# Patient Record
Sex: Female | Born: 1975 | Race: White | Hispanic: No | Marital: Married
Health system: Southern US, Community
[De-identification: ages and names within clinical notes are randomized; demographics above are authoritative.]

## PROBLEM LIST (undated history)

## (undated) ENCOUNTER — Emergency Department (HOSPITAL_COMMUNITY): Payer: BC Managed Care – PPO

## (undated) DIAGNOSIS — G43909 Migraine, unspecified, not intractable, without status migrainosus: Secondary | ICD-10-CM

## (undated) DIAGNOSIS — E7030 Albinism, unspecified: Secondary | ICD-10-CM

## (undated) DIAGNOSIS — J45909 Unspecified asthma, uncomplicated: Secondary | ICD-10-CM

## (undated) DIAGNOSIS — H5500 Unspecified nystagmus: Secondary | ICD-10-CM

## (undated) DIAGNOSIS — F329 Major depressive disorder, single episode, unspecified: Secondary | ICD-10-CM

## (undated) DIAGNOSIS — K219 Gastro-esophageal reflux disease without esophagitis: Secondary | ICD-10-CM

## (undated) DIAGNOSIS — F32A Depression, unspecified: Secondary | ICD-10-CM

## (undated) DIAGNOSIS — F419 Anxiety disorder, unspecified: Secondary | ICD-10-CM

## (undated) DIAGNOSIS — IMO0001 Reserved for inherently not codable concepts without codable children: Secondary | ICD-10-CM

## (undated) HISTORY — DX: Albinism, unspecified: E70.30

## (undated) HISTORY — PX: TOOTH EXTRACTION: SUR596

## (undated) HISTORY — PX: UPPER GASTROINTESTINAL ENDOSCOPY: SHX188

## (undated) HISTORY — DX: Migraine, unspecified, not intractable, without status migrainosus: G43.909

## (undated) HISTORY — DX: Anxiety disorder, unspecified: F41.9

## (undated) HISTORY — DX: Unspecified nystagmus: H55.00

## (undated) HISTORY — DX: Major depressive disorder, single episode, unspecified: F32.9

## (undated) HISTORY — DX: Depression, unspecified: F32.A

## (undated) HISTORY — PX: EYE SURGERY: SHX253

## (undated) HISTORY — DX: Gastro-esophageal reflux disease without esophagitis: K21.9

---

## 2000-10-29 ENCOUNTER — Emergency Department (HOSPITAL_COMMUNITY): Admission: EM | Admit: 2000-10-29 | Discharge: 2000-10-30 | Payer: Self-pay | Admitting: Emergency Medicine

## 2001-11-07 ENCOUNTER — Encounter: Admission: RE | Admit: 2001-11-07 | Discharge: 2001-11-07 | Payer: Self-pay | Admitting: Sports Medicine

## 2001-11-21 ENCOUNTER — Encounter: Admission: RE | Admit: 2001-11-21 | Discharge: 2001-12-28 | Payer: Self-pay

## 2002-09-08 ENCOUNTER — Emergency Department (HOSPITAL_COMMUNITY): Admission: EM | Admit: 2002-09-08 | Discharge: 2002-09-08 | Payer: Self-pay | Admitting: Emergency Medicine

## 2003-09-14 ENCOUNTER — Emergency Department (HOSPITAL_COMMUNITY): Admission: EM | Admit: 2003-09-14 | Discharge: 2003-09-14 | Payer: Self-pay | Admitting: Emergency Medicine

## 2003-09-28 ENCOUNTER — Encounter: Admission: RE | Admit: 2003-09-28 | Discharge: 2003-09-28 | Payer: Self-pay | Admitting: Orthopedic Surgery

## 2003-11-07 ENCOUNTER — Emergency Department (HOSPITAL_COMMUNITY): Admission: EM | Admit: 2003-11-07 | Discharge: 2003-11-07 | Payer: Self-pay | Admitting: Emergency Medicine

## 2003-11-23 ENCOUNTER — Ambulatory Visit (HOSPITAL_COMMUNITY): Admission: RE | Admit: 2003-11-23 | Discharge: 2003-11-23 | Payer: Self-pay | Admitting: Neurology

## 2003-12-28 ENCOUNTER — Emergency Department (HOSPITAL_COMMUNITY): Admission: EM | Admit: 2003-12-28 | Discharge: 2003-12-28 | Payer: Self-pay | Admitting: Emergency Medicine

## 2003-12-30 ENCOUNTER — Other Ambulatory Visit: Admission: RE | Admit: 2003-12-30 | Discharge: 2003-12-30 | Payer: Self-pay | Admitting: Gynecology

## 2004-11-20 ENCOUNTER — Emergency Department (HOSPITAL_COMMUNITY): Admission: EM | Admit: 2004-11-20 | Discharge: 2004-11-21 | Payer: Self-pay | Admitting: Emergency Medicine

## 2004-12-09 ENCOUNTER — Encounter: Admission: RE | Admit: 2004-12-09 | Discharge: 2004-12-09 | Payer: Self-pay | Admitting: Internal Medicine

## 2006-05-30 ENCOUNTER — Other Ambulatory Visit: Admission: RE | Admit: 2006-05-30 | Discharge: 2006-05-30 | Payer: Self-pay | Admitting: Gynecology

## 2006-07-20 ENCOUNTER — Emergency Department (HOSPITAL_COMMUNITY): Admission: EM | Admit: 2006-07-20 | Discharge: 2006-07-21 | Payer: Self-pay | Admitting: Emergency Medicine

## 2008-04-15 ENCOUNTER — Inpatient Hospital Stay (HOSPITAL_COMMUNITY): Admission: RE | Admit: 2008-04-15 | Discharge: 2008-04-18 | Payer: Self-pay | Admitting: Obstetrics and Gynecology

## 2008-04-19 ENCOUNTER — Inpatient Hospital Stay (HOSPITAL_COMMUNITY): Admission: AD | Admit: 2008-04-19 | Discharge: 2008-04-19 | Payer: Self-pay | Admitting: Obstetrics and Gynecology

## 2008-04-22 ENCOUNTER — Inpatient Hospital Stay (HOSPITAL_COMMUNITY): Admission: AD | Admit: 2008-04-22 | Discharge: 2008-04-22 | Payer: Self-pay | Admitting: Obstetrics and Gynecology

## 2010-07-14 ENCOUNTER — Ambulatory Visit (HOSPITAL_COMMUNITY)
Admission: RE | Admit: 2010-07-14 | Discharge: 2010-07-14 | Payer: Self-pay | Source: Home / Self Care | Admitting: Obstetrics and Gynecology

## 2010-07-26 LAB — HEPATITIS B SURFACE ANTIGEN: Hepatitis B Surface Ag: NEGATIVE

## 2010-07-26 LAB — RUBELLA ANTIBODY, IGM: Rubella: IMMUNE

## 2010-07-26 LAB — ABO/RH: RH Type: NEGATIVE

## 2010-08-15 NOTE — L&D Delivery Note (Signed)
Delivery Note At 4:18 PM a healthy female was delivered via Vaginal, Spontaneous Delivery (Presentation: Left Occiput Anterior).  APGAR: 9, 9; weight 6 lb 6.1 oz (2895 g).   Placenta status: Intact, Spontaneous.  Cord: 3 vessels with the following complications: None.   Episiotomy: None Lacerations: 2nd degree Suture Repair: 3.0 vicryl rapide Est. Blood Loss (mL): 300  Mom to postpartum.  Baby to nursery-stable.  Oliver Pila 02/24/2011, 6:59 PM

## 2010-12-01 LAB — RPR: RPR: NONREACTIVE

## 2010-12-31 NOTE — Discharge Summary (Signed)
Kimberly Fernandez, WEINMANN NO.:  [id number]   MEDICAL RECORD NO.:  [id number]          PATIENT TYPE:  INP   LOCATION:                                FACILITY:  [facility]   PHYSICIAN:  Huel Cote, M.D. DATE OF BIRTH:  1975-11-05   DATE OF ADMISSION:  04/15/2008  DATE OF DISCHARGE:  04/18/2008                               DISCHARGE SUMMARY   DISCHARGE DIAGNOSES:  1. Post-term pregnancy at 14-6/7 weeks delivered.  2. Status post vacuum-assisted vaginal delivery.   DISCHARGE MEDICATIONS:  1. Motrin 600 mg p.o. every 6 hours.  2. Iron sulfate 325 mg twice daily.   HOSPITAL COURSE:  The patient was a 35 year old, G1, P0, who was  admitted at 11 and 6/7 weeks' gestation for induction of labor given  post due date.  Prenatal care was eventful only for the patient having  albinism and nystagmus with a positive group B Strep status.   Prenatal labs are as follows:  O negative, antibody negative, RPR  nonreactive, rubella immune, hepatitis B surface antigen negative, HIV  negative, GC negative, Chlamydia negative, group B Strep positive, 1-  hour Glucola 121, first trimester screen normal.   PAST OBSTETRICAL HISTORY:  None.   PAST MEDICAL HISTORY:  History of depression requiring no medications.   PAST SURGICAL HISTORY:  None.   PAST GYN HISTORY:  None.   On admission, she was afebrile with stable vital signs.  Fetal heart  rate was reactive.  Cervix was 51+ in a minus 2 station.  She had  rupture of membranes performed with clear fluid noted and was placed on  penicillin for her positive group B Strep status before this occurred.  She progressed throughout the evening and reached complete dilation with  pushing of approximately 1-1/2 hour with descent of vertex to the  outlet.  Fetal heart rate remained overall reassuring, but had  persistent variable decelerations which reached down to the 70s with  slowing recovery over the last 5-10 minutes of pushing.  She was  counseled and agreed to a vacuum-assisted delivery with a Kiwi vacuum  applied to a +2 to +3 station ROA in vertex.  The vertex was delivered  to crowning with one pull and popped off at the very end.  The patient  pushed out the remainder of the head over a second-degree laceration.  Nuchal x2 was reduced over the head.  The remainder of the body  delivered without problem.  Apgars were 6 and 9.  The baby was slightly  floppy, but responded quickly to stimulation.  There was also a true  knot noted in the cord.  Second-degree laceration was repaired with 2-0  Vicryl.  Cervix and rectum were intact, and the sphincter was reinforced  as well with 2-0 Vicryl.  She then admitted for routine postpartum care  and was noted to have some elevated blood pressures of 150s/90s.  She  had PIH labs performed which were normal and was having no significant  symptoms at that time.  Therefore, she was felt stable to be discharged  home with close follow  up in the  office of her blood pressure within the next few days.  She was  discharged home with her Motrin and iron given a hemoglobin of 7.9;  however, starting hemoglobin was 10.1.  She understands her instructions  and pelvic rest and will follow up in the office in the next few days  for a blood pressure check.      Huel Cote, M.D.  Electronically Signed     KR/MEDQ  D:  05/19/2008  T:  05/20/2008  Job:  [id number]

## 2011-02-19 ENCOUNTER — Other Ambulatory Visit (HOSPITAL_COMMUNITY): Payer: Self-pay | Admitting: *Deleted

## 2011-02-19 ENCOUNTER — Encounter (HOSPITAL_COMMUNITY): Payer: Self-pay | Admitting: *Deleted

## 2011-02-24 ENCOUNTER — Encounter (HOSPITAL_COMMUNITY): Payer: Self-pay | Admitting: *Deleted

## 2011-02-24 ENCOUNTER — Encounter (HOSPITAL_COMMUNITY): Payer: Self-pay | Admitting: Anesthesiology

## 2011-02-24 ENCOUNTER — Encounter (HOSPITAL_COMMUNITY): Payer: BC Managed Care – PPO

## 2011-02-24 ENCOUNTER — Inpatient Hospital Stay (HOSPITAL_COMMUNITY): Payer: BC Managed Care – PPO | Admitting: Anesthesiology

## 2011-02-24 ENCOUNTER — Inpatient Hospital Stay (HOSPITAL_COMMUNITY)
Admission: AD | Admit: 2011-02-24 | Discharge: 2011-02-26 | DRG: 373 | Disposition: A | Discharge status: Home / Self Care | Payer: BC Managed Care – PPO | Source: Ambulatory Visit | Attending: Obstetrics and Gynecology | Admitting: Obstetrics and Gynecology

## 2011-02-24 DIAGNOSIS — O365990 Maternal care for other known or suspected poor fetal growth, unspecified trimester, not applicable or unspecified: Principal | ICD-10-CM | POA: Diagnosis present

## 2011-02-24 LAB — CBC
Hemoglobin: 11 g/dL — ABNORMAL LOW (ref 12.0–15.0)
MCH: 28.4 pg (ref 26.0–34.0)
MCHC: 32.8 g/dL (ref 30.0–36.0)
Platelets: 157 10*3/uL (ref 150–400)

## 2011-02-24 LAB — RPR: RPR Ser Ql: NONREACTIVE

## 2011-02-24 MED ORDER — EPHEDRINE 5 MG/ML INJ
10.0000 mg | INTRAVENOUS | Status: DC | PRN
  Filled 2011-02-24 (×2): qty 4

## 2011-02-24 MED ORDER — RHO D IMMUNE GLOBULIN 1500 UNIT/2ML IJ SOLN: 300.0000 ug | Freq: Once | INTRAMUSCULAR | Status: DC

## 2011-02-24 MED ORDER — PHENYLEPHRINE 40 MCG/ML (10ML) SYRINGE FOR IV PUSH (FOR BLOOD PRESSURE SUPPORT)
80.0000 ug | PREFILLED_SYRINGE | INTRAVENOUS | Status: DC | PRN
  Filled 2011-02-24: qty 5

## 2011-02-24 MED ORDER — ONDANSETRON HCL 4 MG/2ML IJ SOLN: 4.0000 mg | INTRAMUSCULAR | Status: DC | PRN

## 2011-02-24 MED ORDER — IBUPROFEN 600 MG PO TABS
600.0000 mg | ORAL_TABLET | Freq: Four times a day (QID) | ORAL | Status: DC
  Administered 2011-02-25 – 2011-02-26 (×7): 600 mg via ORAL
  Filled 2011-02-24: qty 1

## 2011-02-24 MED ORDER — IBUPROFEN 600 MG PO TABS
600.0000 mg | ORAL_TABLET | Freq: Four times a day (QID) | ORAL | Status: DC | PRN
  Filled 2011-02-24 (×6): qty 1

## 2011-02-24 MED ORDER — SIMETHICONE 80 MG PO CHEW: 80.0000 mg | CHEWABLE_TABLET | ORAL | Status: DC | PRN

## 2011-02-24 MED ORDER — ONDANSETRON HCL 4 MG/2ML IJ SOLN: 4.0000 mg | Freq: Four times a day (QID) | INTRAMUSCULAR | Status: DC | PRN

## 2011-02-24 MED ORDER — ZOLPIDEM TARTRATE 5 MG PO TABS: 5.0000 mg | ORAL_TABLET | Freq: Every evening | ORAL | Status: DC | PRN

## 2011-02-24 MED ORDER — LIDOCAINE HCL (PF) 1 % IJ SOLN
30.0000 mL | Freq: Once | INTRAMUSCULAR | Status: AC | PRN
  Administered 2011-02-24: 30 mL via SUBCUTANEOUS
  Filled 2011-02-24 (×2): qty 30

## 2011-02-24 MED ORDER — PSEUDOEPHEDRINE HCL 30 MG PO TABS
30.0000 mg | ORAL_TABLET | Freq: Four times a day (QID) | ORAL | Status: DC | PRN
  Administered 2011-02-24 (×2): 30 mg via ORAL
  Filled 2011-02-24 (×2): qty 1

## 2011-02-24 MED ORDER — NALBUPHINE HCL 10 MG/ML IJ SOLN
5.0000 mg | INTRAMUSCULAR | Status: DC | PRN
  Filled 2011-02-24: qty 1

## 2011-02-24 MED ORDER — OXYTOCIN 20 UNITS IN LACTATED RINGERS INFUSION - SIMPLE
1.0000 m[IU]/min | INTRAVENOUS | Status: DC
Start: 2011-02-24 — End: 2011-02-24
  Administered 2011-02-24: 2 m[IU]/min via INTRAVENOUS
  Administered 2011-02-24: 8 m[IU]/min via INTRAVENOUS
  Administered 2011-02-24: 333 m[IU]/min via INTRAVENOUS

## 2011-02-24 MED ORDER — SENNOSIDES-DOCUSATE SODIUM 8.6-50 MG PO TABS
1.0000 | ORAL_TABLET | Freq: Every day | ORAL | Status: DC
  Administered 2011-02-25: 1 via ORAL

## 2011-02-24 MED ORDER — OXYTOCIN 20 UNITS IN LACTATED RINGERS INFUSION - SIMPLE
125.0000 mL/h | Freq: Once | INTRAVENOUS | Status: DC
  Filled 2011-02-24: qty 1000

## 2011-02-24 MED ORDER — PHENYLEPHRINE 40 MCG/ML (10ML) SYRINGE FOR IV PUSH (FOR BLOOD PRESSURE SUPPORT)
80.0000 ug | PREFILLED_SYRINGE | INTRAVENOUS | Status: DC | PRN
  Filled 2011-02-24 (×2): qty 5

## 2011-02-24 MED ORDER — FLEET ENEMA 7-19 GM/118ML RE ENEM: 1.0000 | ENEMA | RECTAL | Status: DC | PRN

## 2011-02-24 MED ORDER — AZITHROMYCIN 500 MG PO TABS
500.0000 mg | ORAL_TABLET | Freq: Every day | ORAL | Status: DC
  Filled 2011-02-24: qty 1

## 2011-02-24 MED ORDER — LIDOCAINE HCL 1.5 % IJ SOLN
INTRAMUSCULAR | Status: DC | PRN
  Administered 2011-02-24: 2 mL
  Administered 2011-02-24 (×2): 5 mL

## 2011-02-24 MED ORDER — BENZOCAINE-MENTHOL 20-0.5 % EX AERO: 1.0000 "application " | INHALATION_SPRAY | CUTANEOUS | Status: DC | PRN

## 2011-02-24 MED ORDER — NALBUPHINE SYRINGE 5 MG/0.5 ML
INJECTION | INTRAMUSCULAR | Status: AC
  Administered 2011-02-24: 5 mg via INTRAVENOUS
  Filled 2011-02-24: qty 0.5

## 2011-02-24 MED ORDER — OXYCODONE-ACETAMINOPHEN 5-325 MG PO TABS
2.0000 | ORAL_TABLET | ORAL | Status: DC | PRN
  Filled 2011-02-24: qty 1

## 2011-02-24 MED ORDER — DIPHENHYDRAMINE HCL 25 MG PO CAPS: 25.0000 mg | ORAL_CAPSULE | Freq: Four times a day (QID) | ORAL | Status: DC | PRN

## 2011-02-24 MED ORDER — LACTATED RINGERS IV SOLN
INTRAVENOUS | Status: DC
  Administered 2011-02-24: 09:00:00 via INTRAVENOUS

## 2011-02-24 MED ORDER — LACTATED RINGERS IV SOLN
500.0000 mL | INTRAVENOUS | Status: AC | PRN
  Administered 2011-02-24: 1000 mL via INTRAVENOUS

## 2011-02-24 MED ORDER — AZITHROMYCIN 250 MG PO TABS: 250.0000 mg | ORAL_TABLET | Freq: Every day | ORAL | Status: DC

## 2011-02-24 MED ORDER — ONDANSETRON HCL 4 MG PO TABS: 4.0000 mg | ORAL_TABLET | ORAL | Status: DC | PRN

## 2011-02-24 MED ORDER — FENTANYL 2.5 MCG/ML BUPIVACAINE 1/10 % EPIDURAL INFUSION (WH - ANES)
2.0000 mL/h | INTRAMUSCULAR | Status: DC
  Administered 2011-02-24 (×2): 14 mL/h via EPIDURAL
  Filled 2011-02-24 (×2): qty 60

## 2011-02-24 MED ORDER — TETANUS-DIPHTH-ACELL PERTUSSIS 5-2.5-18.5 LF-MCG/0.5 IM SUSP
0.5000 mL | Freq: Once | INTRAMUSCULAR | Status: AC
  Administered 2011-02-26: 0.5 mL via INTRAMUSCULAR
  Filled 2011-02-24: qty 0.5

## 2011-02-24 MED ORDER — LACTATED RINGERS IV SOLN: 500.0000 mL | Freq: Once | INTRAVENOUS | Status: DC

## 2011-02-24 MED ORDER — DIPHENHYDRAMINE HCL 50 MG/ML IJ SOLN
12.5000 mg | INTRAMUSCULAR | Status: DC | PRN
  Administered 2011-02-24: 50 mg via INTRAVENOUS
  Filled 2011-02-24: qty 1

## 2011-02-24 MED ORDER — OXYCODONE-ACETAMINOPHEN 5-325 MG PO TABS
1.0000 | ORAL_TABLET | ORAL | Status: DC | PRN
  Administered 2011-02-24 – 2011-02-25 (×2): 1 via ORAL
  Filled 2011-02-24: qty 1

## 2011-02-24 MED ORDER — WITCH HAZEL-GLYCERIN EX PADS: MEDICATED_PAD | CUTANEOUS | Status: DC | PRN

## 2011-02-24 MED ORDER — TERBUTALINE SULFATE 1 MG/ML IJ SOLN: 0.2500 mg | Freq: Once | INTRAMUSCULAR | Status: DC | PRN

## 2011-02-24 MED ORDER — CITRIC ACID-SODIUM CITRATE 334-500 MG/5ML PO SOLN: 30.0000 mL | ORAL | Status: DC | PRN

## 2011-02-24 MED ORDER — PSEUDOEPHEDRINE HCL 30 MG/5ML PO LIQD: 30.0000 mg | Freq: Four times a day (QID) | ORAL | Status: DC

## 2011-02-24 MED ORDER — EPHEDRINE 5 MG/ML INJ
10.0000 mg | INTRAVENOUS | Status: DC | PRN
  Filled 2011-02-24: qty 4

## 2011-02-24 MED ORDER — PRENATAL PLUS 27-1 MG PO TABS
1.0000 | ORAL_TABLET | Freq: Every day | ORAL | Status: DC
  Administered 2011-02-25 – 2011-02-26 (×2): 1 via ORAL
  Filled 2011-02-24 (×2): qty 1

## 2011-02-24 MED ORDER — LANOLIN HYDROUS EX OINT: TOPICAL_OINTMENT | CUTANEOUS | Status: DC | PRN

## 2011-02-24 MED ORDER — ACETAMINOPHEN 325 MG PO TABS: 650.0000 mg | ORAL_TABLET | ORAL | Status: DC | PRN

## 2011-02-24 NOTE — Anesthesia Preprocedure Evaluation (Signed)
Anesthesia Evaluation  Name, MR# and DOB Patient awake  General Assessment Comment  Reviewed: Allergy & Precautions, H&P  and Patient's Chart, lab work & pertinent test results  History of Anesthesia Complications Negative for: history of anesthetic complications  Airway Mallampati: III TM Distance: >3 FB Neck ROM: full    Dental No notable dental hx (+) Teeth Intact   Pulmonaryneg pulmonary ROS    clear to auscultation  pulmonary exam normal   Cardiovascular regular Normal   Neuro/PsychNegative Neurological ROS Negative Psych ROS  GI/Hepatic/Renal negative GI ROS, negative Liver ROS, and negative Renal ROS (+)       Endo/Other  Negative Endocrine ROS (+)   Abdominal   Musculoskeletal  Hematology negative hematology ROS (+)   Peds  Reproductive/Obstetrics (+) Pregnancy   Anesthesia Other Findings             Anesthesia Physical Anesthesia Plan  ASA: II  Anesthesia Plan: Epidural   Post-op Pain Management:    Induction:   Airway Management Planned:   Additional Equipment:   Intra-op Plan:   Post-operative Plan:   Informed Consent: I have reviewed the patients History and Physical, chart, labs and discussed the procedure including the risks, benefits and alternatives for the proposed anesthesia with the patient or authorized representative who has indicated his/her understanding and acceptance.     Plan Discussed with:   Anesthesia Plan Comments:         Anesthesia Quick Evaluation

## 2011-02-24 NOTE — Anesthesia Procedure Notes (Addendum)
Epidural Patient location during procedure: OB Start time: 02/24/2011 12:55 PM Reason for block: procedure for pain  Staffing Anesthesiologist: CASSIDY, AMY L. Performed by: anesthesiologist   Preanesthetic Checklist Completed: patient identified, site marked, surgical consent, pre-op evaluation, timeout performed, IV checked, risks and benefits discussed and monitors and equipment checked  Epidural Patient position: sitting Prep: site prepped and draped and DuraPrep Patient monitoring: continuous pulse ox, blood pressure and heart rate Approach: midline Injection technique: LOR air  Needle:  Needle type: Tuohy  Needle gauge: 17 G Needle length: 9 cm Needle insertion depth: 6 cm cm Catheter type: closed end flexible Catheter size: 19 Gauge Catheter at skin depth: 11 cm Test dose: 1.5% lidocaine  Assessment Events: blood not aspirated, injection not painful, no injection resistance, negative IV test and no paresthesia  Additional Notes Difficult due to pt. Movement and poor position. Discussed risk of headache, infection, bleeding, nerve injury and failed or incomplete block.  Patient voices understanding and wishes to proceed.

## 2011-02-24 NOTE — Progress Notes (Signed)
Kimberly Fernandez is a 35 y.o. G2P1001 at [redacted]w[redacted]d by LMP admitted for induction of labor due to Poor fetal growth.  Subjective: Pt is comfortable with epidural  Objective: BP 131/71  Pulse 91  Temp(Src) 98 F (36.7 C) (Oral)  Resp 20  Ht 5\' 3"  (1.6 m)  Wt 90.266 kg (199 lb)  BMI 35.25 kg/m2  LMP 05/23/2010      FHT:  FHR: 140 bpm, variability: moderate,  accelerations:  Present,  decelerations:  Absent UC:   regular, every 4 minutes SVE:  50/3/-2 posterior IUPC placed without difficulty Labs: Lab Results  Component Value Date   WBC 13.6* 02/24/2011   HGB 11.0* 02/24/2011   HCT 33.5* 02/24/2011   MCV 86.6 02/24/2011   PLT 157 02/24/2011    Assessment / Plan: Induction of labor due to Poor fetal growth,  progressing well on pitocin  Labor: Progressing normally  Fetal Wellbeing:  Category I Pain Control:  Epidural I/D:  n/a Anticipated MOD:  NSVD  Kimberly Fernandez 02/24/2011, 1:50 PM

## 2011-02-24 NOTE — H&P (Signed)
Kimberly Fernandez is a 35 y.o. female presenting for induction of labor given term status at 39+ weeks and desire for induction given issues with husband starting a new job and  A recent US showing possible developing SGA with EFW at the 17%ile.  NST's and fluid have been WNL.  Prenatal care complicated by h/o albinism in mother, no other significant problems. Rh negative, received rhogam. Maternal Medical History:  Contractions: Frequency: irregular.   Perceived severity is mild.    Fetal activity: Perceived fetal activity is normal.    Prenatal complications: No bleeding.     OB History    Grav Para Term Preterm Abortions TAB SAB Ect Mult Living   2 1 1  0 0 0 0 0 0 1     History reviewed. No pertinent past surgical history. Family History: family history includes Cancer in her maternal grandfather; Heart disease in her maternal grandmother; and Hypertension in her father. Social History:  reports that she has never smoked. She does not have any smokeless tobacco history on file. She reports that she does not drink alcohol or use illicit drugs. Past MedHx  Albinism                      Nystagmus           H/o depression PastObHx  NSVD 6#8oz 2009  ROS  Negative  FHR reactive with irregular ctx Dilation: 2 Effacement (%): 30 Station: -2 Exam by:: m wilkins,rnc Blood pressure 143/67, pulse 105, temperature 97.6 F (36.4 C), resp. rate 20, height 5\' 3"  (1.6 m), weight 90.266 kg (199 lb), last menstrual period 05/23/2010. Exam   Physical Exam  CV RRR Lungs CTA bilaterally Abd gravid NT  Prenatal labs: ABO, Rh:   Antibody: Negative (12/12 0000) Rubella:   RPR: Nonreactive (04/18 0000)  HBsAg: Negative (12/12 0000)  HIV: Non-reactive (04/18 0000)  GBS:  negative One hour glucola 107 First trimester Screen WNL AFP WNL GC negative Chlam negative    Assessment/Plan: Pt for induction of labor with borderline favorable cervix for possible SGA and social reason.  D/w pt may be  slightly prolonged, but as is multiparous, should not be A big issue.  Desires epidural when in labor.  Oliver Pila 02/24/2011, 10:01 AM

## 2011-02-25 ENCOUNTER — Encounter (HOSPITAL_COMMUNITY): Payer: Self-pay | Admitting: *Deleted

## 2011-02-25 LAB — ABO/RH: ABO/RH(D): O NEG

## 2011-02-25 LAB — CBC
Hemoglobin: 9.9 g/dL — ABNORMAL LOW (ref 12.0–15.0)
Platelets: 147 10*3/uL — ABNORMAL LOW (ref 150–400)
RBC: 3.47 MIL/uL — ABNORMAL LOW (ref 3.87–5.11)
WBC: 15 10*3/uL — ABNORMAL HIGH (ref 4.0–10.5)

## 2011-02-25 MED ORDER — AZITHROMYCIN 250 MG PO TABS
500.0000 mg | ORAL_TABLET | Freq: Once | ORAL | Status: AC
  Administered 2011-02-25: 500 mg via ORAL
  Filled 2011-02-25: qty 2

## 2011-02-25 MED ORDER — AZITHROMYCIN 250 MG PO TABS
250.0000 mg | ORAL_TABLET | Freq: Every day | ORAL | Status: DC
  Administered 2011-02-26: 250 mg via ORAL
  Filled 2011-02-25 (×3): qty 1

## 2011-02-25 MED ORDER — PSEUDOEPHEDRINE HCL 30 MG PO TABS
30.0000 mg | ORAL_TABLET | Freq: Four times a day (QID) | ORAL | Status: DC | PRN
  Administered 2011-02-25: 30 mg via ORAL
  Filled 2011-02-25: qty 1

## 2011-02-25 NOTE — Progress Notes (Signed)
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 inst to breastfeed every 3 hrs and allow cluster feeding . Gave hand pump with inst.

## 2011-02-25 NOTE — Progress Notes (Signed)
Post Partum Day 1 Subjective: no complaints  Objective: Blood pressure 137/62, pulse 74, temperature 97.5 F (36.4 C), temperature source Oral, resp. rate 20, height 5\' 3"  (1.6 m), weight 90.266 kg (199 lb), last menstrual period 05/23/2010, unknown if currently breastfeeding.  Physical Exam:  General: alert Lochia: appropriate Uterine Fundus: firm } DVT Evaluation: No evidence of DVT seen on physical exam.   Basename 02/25/11 0545 02/24/11 0828  HGB 9.9* 11.0*  HCT 30.3* 33.5*    Assessment/Plan: Plan for discharge tomorrow   LOS: 1 day   Kimberly Fernandez W 02/25/2011, 8:38 AM

## 2011-02-26 MED ORDER — OXYCODONE-ACETAMINOPHEN 5-325 MG PO TABS: 1.0000 | ORAL_TABLET | Freq: Four times a day (QID) | ORAL | Status: AC | PRN

## 2011-02-26 MED ORDER — IBUPROFEN 600 MG PO TABS: 600.0000 mg | ORAL_TABLET | Freq: Four times a day (QID) | ORAL | Status: AC | PRN

## 2011-02-26 NOTE — Progress Notes (Signed)
PATIENT HAS NOW DECIDED TO EXCLUSIVELY INSTEAD OF PUMPING.  BREASTFEEDING DISCHARGE TEACHING DONE.  ENCOURAGED TO CALL LC OFFICE WITH QUESTIONS OR CONCERNS.  LPOWELL RN, IBCLC

## 2011-02-26 NOTE — Discharge Summary (Signed)
NAMEMAXINE, FREDMAN NO.:  [id number]  MEDICAL RECORD NO.:  [id number]  LOCATION:  9135                          FACILITY:  [facility]  PHYSICIAN:  Malachi Pro. Ambrose Mantle, M.D. DATE OF BIRTH:  1975-10-15  DATE OF ADMISSION:  02/24/2011 DATE OF DISCHARGE:  02/26/2011                              DISCHARGE SUMMARY   This is a 35 year old female presented for induction of labor given term status at 39+ weeks and desire for induction given issues with her husband starting a new job and a recent ultrasound showing possible developing small for gestational age infant with estimated fetal weight at the 17th percentile.  Nonstress tests and fluid have been normal. Prenatal care complicated by history of albinism in the mother.  No other significant problems.  She is Rh negative and received RhoGAM. Her history has included in her H&P.  The patient was admitted.  The cervix was 2 cm dilated.  Labs showed antibody was negative.  She was Rh negative.  RPR nonreactive, hepatitis B surface antigen negative, HIV negative, GBS negative, 1-hour Glucola 107.  First trimester screen and AFP normal.  GC and Chlamydia negative.  By 4:18 p.m., the patient delivered a healthy female infant vaginally LOA, 6 pounds 6.1 ounces, Apgar's of 9 and 9 at 1 and 5 minutes.  Dr. Senaida Ores was in attendance.  Placenta was intact, spontaneous cord, 3 vessels with no complications.  No episiotomy, second-degree laceration repair with 3-0 Vicryl Rapide, blood loss about 300 mL.  Mother went to postpartum, baby to the nursery stable.  Postpartum, the patient did quite well.  She had no particular problems and was ready for discharge on the second postpartum day.  Her initial hemoglobin was 11.0, hematocrit 33.5, white count 13,600, platelet count 157,000.  Followup hemoglobin 9.9, RPR was nonreactive.  FINAL DIAGNOSIS:  Intrauterine pregnancy at close to term, delivered left occipitoanterior (fetal  position).  OPERATIONS:  Spontaneous delivery LOA, repair of second-degree midline laceration.  FINAL CONDITION:  Improved.  Instructions include regular discharge instruction booklet.  The patient is to be given the Tdap vaccine and will receive RhoGAM if it is indicated.  She is to return to the office in 6 weeks.  DISCHARGE MEDICATIONS: 1. Percocet 5/325, 12 tablets 1 every 4-6 hours as needed for pain. 2. Motrin 600 mg 30 tablets 1 every 6 hours as needed for pain.     Malachi Pro. Ambrose Mantle, M.D.     TFH/MEDQ  D:  02/26/2011  T:  02/26/2011  Job:  [id number]

## 2011-02-26 NOTE — Progress Notes (Signed)
#  2 afebrile doing well No complaints.

## 2011-02-28 ENCOUNTER — Other Ambulatory Visit: Payer: Self-pay | Admitting: Obstetrics and Gynecology

## 2011-02-28 ENCOUNTER — Encounter (HOSPITAL_COMMUNITY): Payer: Self-pay | Admitting: *Deleted

## 2011-02-28 ENCOUNTER — Inpatient Hospital Stay (HOSPITAL_COMMUNITY)
Admission: AD | Admit: 2011-02-28 | Discharge: 2011-03-01 | DRG: 376 | Disposition: A | Discharge status: Home / Self Care | Payer: BC Managed Care – PPO | Source: Ambulatory Visit | Attending: Obstetrics and Gynecology | Admitting: Obstetrics and Gynecology

## 2011-02-28 ENCOUNTER — Inpatient Hospital Stay (HOSPITAL_COMMUNITY): Payer: BC Managed Care – PPO

## 2011-02-28 DIAGNOSIS — O99893 Other specified diseases and conditions complicating puerperium: Principal | ICD-10-CM | POA: Diagnosis present

## 2011-02-28 DIAGNOSIS — R7989 Other specified abnormal findings of blood chemistry: Secondary | ICD-10-CM | POA: Diagnosis present

## 2011-02-28 DIAGNOSIS — E876 Hypokalemia: Secondary | ICD-10-CM | POA: Diagnosis present

## 2011-02-28 DIAGNOSIS — R51 Headache: Secondary | ICD-10-CM | POA: Diagnosis present

## 2011-02-28 DIAGNOSIS — R0602 Shortness of breath: Secondary | ICD-10-CM | POA: Diagnosis present

## 2011-02-28 LAB — URINALYSIS, ROUTINE W REFLEX MICROSCOPIC
Glucose, UA: NEGATIVE mg/dL
Specific Gravity, Urine: 1.02 (ref 1.005–1.030)
Urobilinogen, UA: 1 mg/dL (ref 0.0–1.0)
pH: 6.5 (ref 5.0–8.0)

## 2011-02-28 LAB — COMPREHENSIVE METABOLIC PANEL
ALT: 31 U/L (ref 0–35)
AST: 44 U/L — ABNORMAL HIGH (ref 0–37)
Albumin: 2.5 g/dL — ABNORMAL LOW (ref 3.5–5.2)
Alkaline Phosphatase: 116 U/L (ref 39–117)
Potassium: 2.7 mEq/L — CL (ref 3.5–5.1)
Sodium: 138 mEq/L (ref 135–145)
Total Protein: 6.7 g/dL (ref 6.0–8.3)

## 2011-02-28 LAB — URINE MICROSCOPIC-ADD ON: Urine-Other: <10

## 2011-02-28 LAB — CBC
MCH: 28.7 pg (ref 26.0–34.0)
MCHC: 32.5 g/dL (ref 30.0–36.0)
Platelets: 166 10*3/uL (ref 150–400)
RBC: 3.66 MIL/uL — ABNORMAL LOW (ref 3.87–5.11)

## 2011-02-28 LAB — DIFFERENTIAL
Basophils Relative: 0 % (ref 0–1)
Eosinophils Absolute: 0.2 10*3/uL (ref 0.0–0.7)
Neutro Abs: 6.8 10*3/uL (ref 1.7–7.7)
Neutrophils Relative %: 69 % (ref 43–77)

## 2011-02-28 MED ORDER — SODIUM CHLORIDE 0.9 % IJ SOLN
3.0000 mL | INTRAMUSCULAR | Status: DC | PRN
  Administered 2011-03-01: 3 mL via INTRAVENOUS

## 2011-02-28 MED ORDER — DOCUSATE SODIUM 100 MG PO CAPS
100.0000 mg | ORAL_CAPSULE | Freq: Every day | ORAL | Status: DC
  Administered 2011-03-01: 100 mg via ORAL
  Filled 2011-02-28 (×2): qty 1

## 2011-02-28 MED ORDER — GUAIFENESIN 100 MG/5ML PO SOLN: 15.0000 mL | ORAL | Status: DC | PRN

## 2011-02-28 MED ORDER — KCL-LACTATED RINGERS 20 MEQ/L IV SOLN
INTRAVENOUS | Status: DC
  Filled 2011-02-28: qty 1000

## 2011-02-28 MED ORDER — ZOLPIDEM TARTRATE 5 MG PO TABS: 5.0000 mg | ORAL_TABLET | Freq: Every evening | ORAL | Status: DC | PRN

## 2011-02-28 MED ORDER — KCL-LACTATED RINGERS 20 MEQ/L IV SOLN
INTRAVENOUS | Status: DC
  Filled 2011-02-28 (×2): qty 1000

## 2011-02-28 MED ORDER — MENTHOL 3 MG MT LOZG: 1.0000 | LOZENGE | OROMUCOSAL | Status: DC | PRN

## 2011-02-28 MED ORDER — MAGNESIUM SULFATE 40 MG/ML IJ SOLN
4.0000 g | Freq: Once | INTRAMUSCULAR | Status: AC
  Administered 2011-02-28: 4 g via INTRAVENOUS
  Filled 2011-02-28: qty 100

## 2011-02-28 MED ORDER — ONDANSETRON HCL 4 MG PO TABS: 4.0000 mg | ORAL_TABLET | Freq: Four times a day (QID) | ORAL | Status: DC | PRN

## 2011-02-28 MED ORDER — PRENATAL PLUS 27-1 MG PO TABS
1.0000 | ORAL_TABLET | Freq: Every day | ORAL | Status: DC
  Administered 2011-03-01: 1 via ORAL
  Filled 2011-02-28 (×2): qty 1

## 2011-02-28 MED ORDER — SIMETHICONE 80 MG PO CHEW: 80.0000 mg | CHEWABLE_TABLET | Freq: Four times a day (QID) | ORAL | Status: DC | PRN

## 2011-02-28 MED ORDER — FUROSEMIDE 20 MG PO TABS
20.0000 mg | ORAL_TABLET | Freq: Once | ORAL | Status: AC
  Administered 2011-02-28: 20 mg via ORAL
  Filled 2011-02-28: qty 1

## 2011-02-28 MED ORDER — MAGNESIUM SULFATE 40 G IN LACTATED RINGERS - SIMPLE
2.0000 g/h | Freq: Once | INTRAVENOUS | Status: DC
  Filled 2011-02-28: qty 500

## 2011-02-28 MED ORDER — OXYCODONE-ACETAMINOPHEN 5-325 MG PO TABS
1.0000 | ORAL_TABLET | Freq: Four times a day (QID) | ORAL | Status: DC | PRN
  Administered 2011-02-28: 1 via ORAL
  Administered 2011-03-01: 2 via ORAL
  Administered 2011-03-01: 1 via ORAL
  Filled 2011-02-28: qty 1
  Filled 2011-02-28: qty 2
  Filled 2011-02-28: qty 1

## 2011-02-28 MED ORDER — POTASSIUM CHLORIDE 10 MEQ/100ML IV SOLN
10.0000 meq | INTRAVENOUS | Status: AC
  Administered 2011-02-28 – 2011-03-01 (×4): 10 meq via INTRAVENOUS
  Filled 2011-02-28 (×4): qty 100

## 2011-02-28 MED ORDER — FUROSEMIDE 20 MG PO TABS
20.0000 mg | ORAL_TABLET | Freq: Every day | ORAL | Status: DC
  Filled 2011-02-28: qty 1

## 2011-02-28 MED ORDER — ONDANSETRON HCL 4 MG/2ML IJ SOLN: 4.0000 mg | Freq: Four times a day (QID) | INTRAMUSCULAR | Status: DC | PRN

## 2011-02-28 MED ORDER — ALUM & MAG HYDROXIDE-SIMETH 200-200-20 MG/5ML PO SUSP
30.0000 mL | ORAL | Status: DC | PRN
  Filled 2011-02-28: qty 30

## 2011-02-28 MED ORDER — POTASSIUM CHLORIDE 2 MEQ/ML IV SOLN
INTRAVENOUS | Status: DC
  Administered 2011-02-28: 22:00:00 via INTRAVENOUS
  Filled 2011-02-28 (×2): qty 1000

## 2011-02-28 MED ORDER — FUROSEMIDE 20 MG PO TABS
20.0000 mg | ORAL_TABLET | Freq: Every day | ORAL | Status: DC
  Administered 2011-03-01: 20 mg via ORAL
  Filled 2011-02-28: qty 1

## 2011-02-28 MED ORDER — IBUPROFEN 600 MG PO TABS
600.0000 mg | ORAL_TABLET | Freq: Four times a day (QID) | ORAL | Status: DC | PRN
Start: 2011-02-28 — End: 2011-03-01
  Administered 2011-03-01 (×2): 600 mg via ORAL
  Filled 2011-02-28 (×2): qty 1

## 2011-02-28 NOTE — Progress Notes (Signed)
Pt states she had a SVD on 7-12. With her last pregnancy she developed pulmonary edema and has been having some of the same symptoms. Feels shortness of breath and feeling like unable to get lungs full. Unproductive cough.

## 2011-02-28 NOTE — ED Provider Notes (Signed)
History    patient is a 35 year old white female who is a gravida 2 para 2. She is approximately 4 days postpartum. She had a normal vaginal delivery. She states 2 days ago she began having what felt like congestion. She is now feeling short of breath. She states that she woke up from her sleep with wheezing last night. She states that she has had similar symptoms with her previous pregnancy. She had to take the Lasix because of fluid retention. She denies fever or or chest pain. She denies any abdominal pain or right upper quadrant pain. She denies any new visual disturbances. She denies headache.  Chief Complaint  Patient presents with  . Shortness of Breath   HPI  OB History    Grav Para Term Preterm Abortions TAB SAB Ect Mult Living   2 2 2  0 0 0 0 0 0 2      Past Medical History  Diagnosis Date  . No pertinent past medical history     Past Surgical History  Procedure Date  . Tooth extraction     Family History  Problem Relation Age of Onset  . Hypertension Father   . Heart disease Maternal Grandmother   . Cancer Maternal Grandfather     History  Substance Use Topics  . Smoking status: Never Smoker   . Smokeless tobacco: Not on file  . Alcohol Use: No    Allergies:  Allergies  Allergen Reactions  . Doxycycline Rash  . Aspirin     Prescriptions prior to admission  Medication Sig Dispense Refill  . acetaminophen (TYLENOL) 500 MG tablet Take 1,000 mg by mouth daily as needed. Patient used for pain.       . diphenhydramine-acetaminophen (TYLENOL PM) 25-500 MG TABS Take 1 tablet by mouth at bedtime as needed. Patient used medication for sleep.       . Pseudoephedrine HCl (SUDAFED PO) Take 1 tablet by mouth daily as needed.        Marland Kitchen amoxicillin (AMOXIL) 500 MG tablet Take 500 mg by mouth 2 (two) times daily.        Marland Kitchen ibuprofen (ADVIL,MOTRIN) 600 MG tablet Take 1 tablet (600 mg total) by mouth every 6 (six) hours as needed for pain.  30 tablet  0  .  oxyCODONE-acetaminophen (PERCOCET) 5-325 MG per tablet Take 1-2 tablets by mouth every 6 (six) hours as needed (moderate - severe pain).  12 tablet  0  . Prenatal Vit-Fe Psac Cmplx-FA (PRENATAL MULTIVITAMIN) 60-1 MG tablet Take 1 tablet by mouth daily with breakfast.       . prenatal vitamin w/FE, FA (PRENATAL 1 + 1) 27-1 MG TABS Take 1 tablet by mouth daily.          Review of Systems  Constitutional: Negative for fever, chills and malaise/fatigue.  HENT: Positive for congestion. Negative for sore throat.   Eyes: Negative for blurred vision and double vision.  Respiratory: Positive for cough, shortness of breath and wheezing. Negative for hemoptysis and sputum production.   Cardiovascular: Negative for chest pain, palpitations and leg swelling.  Gastrointestinal: Negative for nausea, vomiting, abdominal pain, diarrhea and constipation.  Genitourinary: Negative for dysuria, urgency, frequency, hematuria and flank pain.  Neurological: Negative for dizziness and headaches.  Psychiatric/Behavioral: Negative for depression and suicidal ideas.   Physical Exam   Blood pressure 142/63, pulse 64, temperature 98.1 F (36.7 C), temperature source Oral, resp. rate 20, height 5\' 3"  (1.6 m), weight 192 lb 3.2 oz (87.181 kg),  last menstrual period 05/23/2010, SpO2 94.00%, unknown if currently breastfeeding.  Physical Exam  Constitutional: She is oriented to person, place, and time. She appears well-developed and well-nourished. No distress.  HENT:  Head: Normocephalic and atraumatic.  Eyes: EOM are normal. Pupils are equal, round, and reactive to light.  Cardiovascular: Normal rate, regular rhythm and normal heart sounds.  Exam reveals no gallop and no friction rub.   No murmur heard. Respiratory: Breath sounds normal. No respiratory distress. She has no wheezes. She has no rales. She exhibits no tenderness.  GI: Soft. She exhibits no distension and no mass. There is no tenderness. There is no rebound  and no guarding.  Neurological: She is alert and oriented to person, place, and time.  Skin: Skin is warm and dry. She is not diaphoretic.  Psychiatric: She has a normal mood and affect. Her behavior is normal. Judgment and thought content normal.    MAU Course  Procedures  Results for orders placed during the hospital encounter of 02/28/11 (from the past 24 hour(s))  CBC     Status: Abnormal   Collection Time   02/28/11  4:55 PM      Component Value Range   WBC 9.8  4.0 - 10.5 (K/uL)   RBC 3.66 (*) 3.87 - 5.11 (MIL/uL)   Hemoglobin 10.5 (*) 12.0 - 15.0 (g/dL)   HCT 16.1 (*) 09.6 - 46.0 (%)   MCV 88.3  78.0 - 100.0 (fL)   MCH 28.7  26.0 - 34.0 (pg)   MCHC 32.5  30.0 - 36.0 (g/dL)   RDW 04.5  40.9 - 81.1 (%)   Platelets 166  150 - 400 (K/uL)  DIFFERENTIAL     Status: Normal (Preliminary result)   Collection Time   02/28/11  4:55 PM      Component Value Range   Neutrophils Relative PENDING  43 - 77 (%)   Neutro Abs PENDING  1.7 - 7.7 (K/uL)   Band Neutrophils PENDING  0 - 10 (%)   Lymphocytes Relative PENDING  12 - 46 (%)   Lymphs Abs PENDING  0.7 - 4.0 (K/uL)   Monocytes Relative PENDING  3 - 12 (%)   Monocytes Absolute PENDING  0.1 - 1.0 (K/uL)   Eosinophils Relative PENDING  0 - 5 (%)   Eosinophils Absolute PENDING  0.0 - 0.7 (K/uL)   Basophils Relative PENDING  0 - 1 (%)   Basophils Absolute PENDING  0.0 - 0.1 (K/uL)   WBC Morphology PENDING     RBC Morphology PENDING     Smear Review PENDING     nRBC PENDING  0 (/100 WBC)   Metamyelocytes Relative PENDING     Myelocytes PENDING     Promyelocytes Absolute PENDING     Blasts PENDING    COMPREHENSIVE METABOLIC PANEL     Status: Abnormal   Collection Time   02/28/11  4:55 PM      Component Value Range   Sodium 138  135 - 145 (mEq/L)   Potassium 2.7 (*) 3.5 - 5.1 (mEq/L)   Chloride 104  96 - 112 (mEq/L)   CO2 24  19 - 32 (mEq/L)   Glucose, Bld 75  70 - 99 (mg/dL)   BUN 9  6 - 23 (mg/dL)   Creatinine, Ser 9.14 (*)  0.50 - 1.10 (mg/dL)   Calcium 8.5  8.4 - 78.2 (mg/dL)   Total Protein 6.7  6.0 - 8.3 (g/dL)   Albumin 2.5 (*) 3.5 - 5.2 (  g/dL)   AST 44 (*) 0 - 37 (U/L)   ALT 31  0 - 35 (U/L)   Alkaline Phosphatase 116  39 - 117 (U/L)   Total Bilirubin 0.3  0.3 - 1.2 (mg/dL)   GFR calc non Af Amer >60  >60 (mL/min)   GFR calc Af Amer >60  >60 (mL/min)  LACTATE DEHYDROGENASE     Status: Abnormal   Collection Time   02/28/11  4:55 PM      Component Value Range   LD 321 (*) 94 - 250 (U/L)  URIC ACID     Status: Abnormal   Collection Time   02/28/11  4:55 PM      Component Value Range   Uric Acid, Serum 8.1 (*) 2.4 - 7.0 (mg/dL)  URINALYSIS, ROUTINE W REFLEX MICROSCOPIC     Status: Abnormal   Collection Time   02/28/11  6:45 PM      Component Value Range   Color, Urine YELLOW  YELLOW    Appearance HAZY (*) CLEAR    Specific Gravity, Urine 1.020  1.005 - 1.030    pH 6.5  5.0 - 8.0    Glucose, UA NEGATIVE  NEGATIVE (mg/dL)   Hgb urine dipstick LARGE (*) NEGATIVE    Bilirubin Urine NEGATIVE  NEGATIVE    Ketones, ur NEGATIVE  NEGATIVE (mg/dL)   Protein, ur NEGATIVE  NEGATIVE (mg/dL)   Urobilinogen, UA 1.0  0.0 - 1.0 (mg/dL)   Nitrite NEGATIVE  NEGATIVE    Leukocytes, UA SMALL (*) NEGATIVE   URINE MICROSCOPIC-ADD ON     Status: Abnormal   Collection Time   02/28/11  6:45 PM      Component Value Range   Squamous Epithelial / LPF FEW (*) RARE    WBC, UA 7-10  <3 (WBC/hpf)   RBC / HPF TOO NUMEROUS TO COUNT  <3 (RBC/hpf)   Bacteria, UA FEW (*) RARE    Urine-Other <10 RENAL EPIS PER 10 FIELD.      I discussed this patient with Dr. Ellyn Hack at length. She is on her way to evaluate and admit this patient.  Clinton Gallant. Rice III, DrHSc, MPAS, PA-C

## 2011-02-28 NOTE — Progress Notes (Signed)
Pt started yesterday with mild coughing, this morning woke with gurgling, wheezing and making an effort to breath.  Says this happened with last preg after delivery.

## 2011-02-28 NOTE — H&P (Signed)
  History and Physical  35 y.o. [id number] with SOB after SVD on 02/24/11, with h/o pulm edema after delivery.  Admits to mild, persistent HA, no blurry vision, no scotomata, RUQ pain.  Also c/o wheeze.    PMH:  Past Medical History  Diagnosis Date  . No pertinent past medical history     PSH:  Past Surgical History  Procedure Date  . Tooth extraction     [id number]: [id number], s/p SVD x 2; last 02/24/11, no STDs, ? Abn pap.  MEDS:  Current Facility-Administered Medications on File Prior to Encounter  Medication Dose Route Frequency Provider Last Rate Last Dose            Current Outpatient Prescriptions on File Prior to Encounter  Medication Sig Dispense Refill                    . prenatal vitamin w/FE, FA (PRENATAL 1 + 1) 27-1 MG TABS Take 1 tablet by mouth daily.          ALL:  Allergies  Allergen Reactions  . Doxycycline Rash  . Aspirin Easy bruising    SH:  History   Social History  . Marital Status: Married         Number of Children: 2  .     Occupational History  . Not on file.   Social History Main Topics  . Smoking status: Never Smoker   . Smokeless tobacco: Not on file  . Alcohol Use: No  . Drug Use: No  . Sexually Active: Yes     NFP   Other Topics Concern  . Not on file   Social History Narrative  . No narrative on file    FH:  Family History  Problem Relation Age of Onset  . Hypertension Father   . Heart disease Maternal Grandmother   . Cancer Maternal Grandfather     PE; BP 141/67  Pulse 64  Temp(Src) 98.1 F (36.7 C) (Oral)  Resp 18  Ht 5\' 3"  (1.6 m)  Wt 87.181 kg (192 lb 3.2 oz)  BMI 34.05 kg/m2  SpO2 94%  LMP 05/23/2010  Breastfeeding? Unknown  Gen: NAD Neck: no Lymph Adenopathy Thyroid: WNL  HEENT: mmm CV:RRR Lungs: CTAB ABD: soft, NT, ND, +BS Ext: sym, NT  CBC 9.8\10.5/166; Potassium = 2.7, AST = 44, ALT 31; LDH 321; UA neg for protein,   A/P: 35 y.o. [id number] PPD#4 with SOB, elevated uric acid, hypokalemia 1.  Admit for Magnesium for sz prophylaxis 2. Replete K+ 3. In AM eval with CBC, CMP 4. CXR - likely treat with Lasix if edema. 5. Monitor BP closely.  Carmelina Noun, MD

## 2011-03-01 LAB — COMPREHENSIVE METABOLIC PANEL
ALT: 28 U/L (ref 0–35)
AST: 36 U/L (ref 0–37)
Calcium: 7.7 mg/dL — ABNORMAL LOW (ref 8.4–10.5)
Creatinine, Ser: 0.54 mg/dL (ref 0.50–1.10)
GFR calc non Af Amer: >60 mL/min (ref >60)
Sodium: 136 mEq/L (ref 135–145)
Total Protein: 6.4 g/dL (ref 6.0–8.3)

## 2011-03-01 LAB — MRSA PCR SCREENING: MRSA by PCR: NEGATIVE

## 2011-03-01 LAB — CBC
HCT: 30.8 % — ABNORMAL LOW (ref 36.0–46.0)
Hemoglobin: 10.1 g/dL — ABNORMAL LOW (ref 12.0–15.0)
MCH: 28.6 pg (ref 26.0–34.0)
MCHC: 32.8 g/dL (ref 30.0–36.0)
RBC: 3.53 MIL/uL — ABNORMAL LOW (ref 3.87–5.11)

## 2011-03-01 MED ORDER — MAGNESIUM SULFATE 40 G IN LACTATED RINGERS - SIMPLE
2.0000 g/h | INTRAVENOUS | Status: DC
  Administered 2011-02-28: 2 g/h via INTRAVENOUS
  Filled 2011-03-01: qty 500

## 2011-03-01 MED ORDER — FUROSEMIDE 10 MG/ML IJ SOLN
20.0000 mg | INTRAMUSCULAR | Status: DC
  Administered 2011-03-01 (×4): 20 mg via INTRAVENOUS
  Filled 2011-03-01 (×4): qty 2

## 2011-03-01 MED ORDER — POTASSIUM CHLORIDE CRYS ER 20 MEQ PO TBCR
40.0000 meq | EXTENDED_RELEASE_TABLET | Freq: Two times a day (BID) | ORAL | Status: DC
  Administered 2011-03-01 (×2): 40 meq via ORAL
  Filled 2011-03-01 (×3): qty 2

## 2011-03-01 NOTE — Progress Notes (Signed)
UR chart review completed.  

## 2011-03-01 NOTE — Progress Notes (Signed)
Post Partum Day 5 Subjective: Pt feels better this am, wheezing subjectively better.  No other c/o.  Pain controlled.    Objective: Blood pressure 134/72, pulse 71, temperature 98.8 F (37.1 C), temperature source Oral, resp. rate 18, height 5\' 3"  (1.6 m), weight 85.004 kg (187 lb 6.4 oz), last menstrual period 05/23/2010, SpO2 91.00%, unknown if currently breastfeeding.  Physical Exam:  General: alert Lungs  Some mild crackles bilaterally Lochia: appropriate Uterine Fundus: firm     Basename 03/01/11 0505 02/28/11 1655  HGB 10.1* 10.5*  HCT 30.8* 32.3*    Assessment/Plan: Pt admitted for concern of developing pulmonary edema and mildly elevated LFT's, Uric acid.  No proteinuria.  BP WNL on MgSO4. Will d/c magnesium and IVF.  Change to po potassium.  Continue lasix and pulmonary toilet and see how O2 sats do.  If improves with diuresis, possible  D/c home this pm or in am tomorrow.   LOS: 1 day   Jessie Schrieber W 03/01/2011, 9:11 AM

## 2011-03-01 NOTE — Progress Notes (Signed)
Pt discharged with written/verbal instructions - unable to print out discharge papers due to Epic difficulty with previous discharge - unable to reconcile meds therefore unable to give papers to pt.  Pt instructed to continue  With previous meds (postpartum discharge orders).  To call office with any concerns - return to office for scheduled 6 week checkup.

## 2011-03-01 NOTE — Progress Notes (Signed)
Infant awake , latched on independently . Assist just with positioning . Mom comfortable .Inafant in a consistent pattern with multi swallows. Mom had questions returning back to work . Per mom "I work around [employer], [employer] .LC recommended good hand washing and changing clothing before pumping . Mom receptive to teaching. Per mom I have a hand pump .

## 2011-03-01 NOTE — Progress Notes (Signed)
  Pt is feeling much better and breathing well.  O2 sats 95% or better on room air.  With lasix has diuresed well, Is now 3+ liters negative.  BP WNL and labs were WNL this am.  Magnesium off since AM.  CV RRR Lungs CTA bilaterally Abd soft NT  Pt admitted with possible developing pulmonary edema and mildly elevated LFT's.  LFT's normalized on f/u labs and pt diuresed  Well with lasix therapy.  Mild hypokalemia treated with IV and po potassium.  Pt feeling much better and desires d/c home.

## 2011-03-02 ENCOUNTER — Other Ambulatory Visit (HOSPITAL_COMMUNITY): Payer: Self-pay | Admitting: *Deleted

## 2011-04-21 NOTE — Discharge Summary (Signed)
Obstetric Discharge Summary Reason for Admission: wheezing and elevated LFT's, possible pp preeclampsia Prenatal Procedures: none Intrapartum Procedures: N/A Postpartum Procedures: diuresis with lasix, magnesium Complications-Operative and Postpartum: none Hemoglobin  Date Value Range Status  03/01/2011 10.1* 12.0-15.0 (g/dL) Final     HCT  Date Value Range Status  03/01/2011 30.8* 36.0-46.0 (%) Final    Discharge Diagnoses: Mild postpartum preeclampsia  Discharge Information: Date: 04/21/2011 Activity: pelvic rest Diet: routine Medications: Ibuprophen and Percocet Condition: improved Instructions: refer to practice specific booklet Discharge to: home Follow-up Information    Follow up with Oris Staffieri W in 6 weeks. (As needed if symptoms worsen)    Contact information:   [address], [address] [place] [address] [phone]          Newborn Data: Pecola Leisure had been d/c with delivery admission and was doing well. Oliver Pila 04/21/2011, 6:42 AM

## 2011-05-18 LAB — RH IMMUNE GLOB WKUP(>/=20WKS)(NOT WOMEN'S HOSP): Fetal Screen: NEGATIVE

## 2011-05-18 LAB — COMPREHENSIVE METABOLIC PANEL
ALT: 26
AST: 47 — ABNORMAL HIGH
Albumin: 2.3 — ABNORMAL LOW
Alkaline Phosphatase: 103
Alkaline Phosphatase: 112
Alkaline Phosphatase: 119 — ABNORMAL HIGH
BUN: 3 — ABNORMAL LOW
BUN: 7
BUN: 7
CO2: 21
CO2: 23
Calcium: 8.9
Chloride: 106
Chloride: 106
Chloride: 109
Chloride: 110
Creatinine, Ser: 0.51
GFR calc Af Amer: >60
GFR calc Af Amer: >60
GFR calc non Af Amer: >60
GFR calc non Af Amer: >60
Glucose, Bld: 76
Glucose, Bld: 96
Potassium: 3.1 — ABNORMAL LOW
Potassium: 3.4 — ABNORMAL LOW
Potassium: 3.9
Total Bilirubin: 0.5
Total Bilirubin: 0.6
Total Bilirubin: 0.6
Total Bilirubin: 0.7
Total Protein: 5.5 — ABNORMAL LOW
Total Protein: 6.1

## 2011-05-18 LAB — CBC
HCT: 24.8 — ABNORMAL LOW
HCT: 27.8 — ABNORMAL LOW
HCT: 30.5 — ABNORMAL LOW
Hemoglobin: 8.1 — ABNORMAL LOW
Hemoglobin: 9.1 — ABNORMAL LOW
MCHC: 33
MCV: 80.3
Platelets: 134 — ABNORMAL LOW
Platelets: 159
RBC: 3.47 — ABNORMAL LOW
RBC: 3.8 — ABNORMAL LOW
RDW: 14.9
RDW: 15.1
RDW: 15.5
WBC: 10.5
WBC: 16 — ABNORMAL HIGH
WBC: 9.9

## 2011-05-18 LAB — LACTATE DEHYDROGENASE
LDH: 190
LDH: 246
LDH: 250

## 2011-05-18 LAB — URINALYSIS, ROUTINE W REFLEX MICROSCOPIC
Bilirubin Urine: NEGATIVE
Glucose, UA: NEGATIVE
Ketones, ur: NEGATIVE
Specific Gravity, Urine: 1.015
pH: 6.5

## 2011-05-18 LAB — URIC ACID
Uric Acid, Serum: 5.5
Uric Acid, Serum: 6
Uric Acid, Serum: 6.8

## 2011-05-18 LAB — DIFFERENTIAL
Basophils Absolute: 0
Basophils Relative: 0
Neutro Abs: 11.4 — ABNORMAL HIGH
Neutrophils Relative %: 74

## 2011-05-18 LAB — RPR: RPR Ser Ql: NONREACTIVE

## 2011-08-25 ENCOUNTER — Emergency Department (INDEPENDENT_AMBULATORY_CARE_PROVIDER_SITE_OTHER)
Admission: EM | Admit: 2011-08-25 | Discharge: 2011-08-25 | Disposition: A | Discharge status: Home / Self Care | Payer: BC Managed Care – PPO | Source: Home / Self Care | Attending: Family Medicine | Admitting: Family Medicine

## 2011-08-25 ENCOUNTER — Encounter (HOSPITAL_COMMUNITY): Payer: Self-pay | Admitting: Emergency Medicine

## 2011-08-25 DIAGNOSIS — J329 Chronic sinusitis, unspecified: Secondary | ICD-10-CM

## 2011-08-25 MED ORDER — AMOXICILLIN-POT CLAVULANATE 875-125 MG PO TABS: 1.0000 | ORAL_TABLET | Freq: Two times a day (BID) | ORAL | Status: DC

## 2011-08-25 MED ORDER — AMOXICILLIN-POT CLAVULANATE 875-125 MG PO TABS: 1.0000 | ORAL_TABLET | Freq: Two times a day (BID) | ORAL | Status: AC

## 2011-08-25 MED ORDER — FLUTICASONE PROPIONATE 50 MCG/ACT NA SUSP: 2.0000 | Freq: Every day | NASAL | Status: DC

## 2011-08-25 NOTE — ED Provider Notes (Signed)
History     CSN: [id number]  Arrival date & time 08/25/11  1201   First MD Initiated Contact with Patient 08/25/11 1315      Chief Complaint  Patient presents with  . Sinusitis  . URI    (Consider location/radiation/quality/duration/timing/severity/associated sxs/prior treatment) HPI Comments: Kimberly Fernandez presents for evaluation of sinus pressure, nasal congestion, with drainage. She has been using nasal saline drops without relief.   Patient is a 36 y.o. female presenting with sinusitis.  Sinusitis  This is a new problem. The current episode started more than 2 days ago. The problem has not changed since onset.There has been no fever. Associated symptoms include congestion and sinus pressure.    Past Medical History  Diagnosis Date  . No pertinent past medical history     Past Surgical History  Procedure Date  . Tooth extraction     Family History  Problem Relation Age of Onset  . Hypertension Father   . Heart disease Maternal Grandmother   . Cancer Maternal Grandfather     History  Substance Use Topics  . Smoking status: Never Smoker   . Smokeless tobacco: Not on file  . Alcohol Use: Yes    OB History    Grav Para Term Preterm Abortions TAB SAB Ect Mult Living   2 2 2  0 0 0 0 0 0 2      Review of Systems  Constitutional: Negative.   HENT: Positive for congestion and sinus pressure.   Eyes: Negative.   Respiratory: Negative.   Cardiovascular: Negative.   Gastrointestinal: Negative.   Genitourinary: Negative.   Musculoskeletal: Negative.   Skin: Negative.   Neurological: Negative.     Allergies  Doxycycline and Aspirin  Home Medications   Current Outpatient Rx  Name Route Sig Dispense Refill  . AMOXICILLIN 500 MG PO TABS Oral Take 500 mg by mouth 2 (two) times daily.      . AMOXICILLIN-POT CLAVULANATE 875-125 MG PO TABS Oral Take 1 tablet by mouth 2 (two) times daily. 20 tablet 0  . DIPHENHYDRAMINE-APAP (SLEEP) 25-500 MG PO TABS Oral Take 1 tablet  by mouth at bedtime as needed. Patient used medication for sleep.     Marland Kitchen FLUTICASONE PROPIONATE 50 MCG/ACT NA SUSP Nasal Place 2 sprays into the nose daily. 16 g 2  . NATALCARE PIC 60-1 MG PO TABS Oral Take 1 tablet by mouth daily with breakfast.     . PRENATAL PLUS 27-1 MG PO TABS Oral Take 1 tablet by mouth daily.      . SUDAFED PO Oral Take 1 tablet by mouth daily as needed.        BP 102/60  Pulse 65  Temp(Src) 98 F (36.7 C) (Oral)  Resp 16  SpO2 98%  LMP 08/17/2011  Physical Exam  Nursing note and vitals reviewed. Constitutional: She is oriented to person, place, and time. She appears well-developed and well-nourished.  HENT:  Head: Normocephalic and atraumatic.  Right Ear: Tympanic membrane is retracted.  Left Ear: Tympanic membrane is retracted.  Mouth/Throat: Uvula is midline, oropharynx is clear and moist and mucous membranes are normal.  Eyes: EOM are normal.  Neck: Normal range of motion.  Pulmonary/Chest: Effort normal and breath sounds normal. She has no wheezes.  Musculoskeletal: Normal range of motion.  Neurological: She is alert and oriented to person, place, and time.  Skin: Skin is warm and dry.  Psychiatric: Her behavior is normal.    ED Course  Procedures (including critical care  time)  Labs Reviewed - No data to display No results found.   1. Rhinosinusitis       MDM  rx given for Augmentin, fluticasone        Richardo Priest, MD 08/25/11 1558

## 2011-08-25 NOTE — ED Notes (Signed)
HERE WITH SINUS INFECTION/URI THAT STARTED Saturday WITH SORE THROAT,POST NASAL GREENISH DRAINAGE WITH SINUS PRESSURE.PT HAS BEEN USING HUMIDIFIER AND SALINE DROPS WITH COLD MEDS BUT NO RELIEF.DENIES FEVER

## 2011-10-04 ENCOUNTER — Encounter (HOSPITAL_COMMUNITY): Payer: Self-pay | Admitting: Emergency Medicine

## 2011-10-04 ENCOUNTER — Emergency Department (INDEPENDENT_AMBULATORY_CARE_PROVIDER_SITE_OTHER)
Admission: EM | Admit: 2011-10-04 | Discharge: 2011-10-04 | Disposition: A | Discharge status: Home / Self Care | Payer: BC Managed Care – PPO | Source: Home / Self Care | Attending: Family Medicine | Admitting: Family Medicine

## 2011-10-04 DIAGNOSIS — R059 Cough, unspecified: Secondary | ICD-10-CM

## 2011-10-04 DIAGNOSIS — J310 Chronic rhinitis: Secondary | ICD-10-CM

## 2011-10-04 DIAGNOSIS — R053 Chronic cough: Secondary | ICD-10-CM

## 2011-10-04 DIAGNOSIS — R05 Cough: Secondary | ICD-10-CM

## 2011-10-04 MED ORDER — CETIRIZINE HCL 10 MG PO CAPS: 1.0000 | ORAL_CAPSULE | ORAL | Status: DC

## 2011-10-04 MED ORDER — PREDNISONE 20 MG PO TABS: ORAL_TABLET | ORAL | Status: AC

## 2011-10-04 MED ORDER — BENZONATATE 100 MG PO CAPS: 100.0000 mg | ORAL_CAPSULE | Freq: Three times a day (TID) | ORAL | Status: AC

## 2011-10-04 MED ORDER — HYDROCODONE-ACETAMINOPHEN 7.5-500 MG/15ML PO SOLN: 15.0000 mL | Freq: Three times a day (TID) | ORAL | Status: AC | PRN

## 2011-10-04 MED ORDER — ALBUTEROL SULFATE HFA 108 (90 BASE) MCG/ACT IN AERS: 1.0000 | INHALATION_SPRAY | Freq: Four times a day (QID) | RESPIRATORY_TRACT | Status: DC | PRN

## 2011-10-04 NOTE — ED Notes (Signed)
Reports this episode started 2 weeks ago, but has had intermittent episodes for 3 months.  Reports infant daughter and herself have been exchanging symptoms.  Cough, nasal congestion, chills, lethargic, intermittently productive cough.

## 2011-10-04 NOTE — ED Notes (Signed)
Given blankets and repositioned bed

## 2011-10-04 NOTE — Discharge Instructions (Signed)
  keep well hydrated. Take the prescribed medications as instructed. Use nasal saline spray at least 3 times a day. (simply saline is over the counter) alternate with flonase. Can use steam water vaporizer at bedside overnight. Prevent hot water burn injuries. Return if new onset of fever, chest pain, difficulty breathing or not keeping fluids down.

## 2011-10-04 NOTE — ED Provider Notes (Signed)
History     CSN: [id number]  Arrival date & time 10/04/11  1638   First MD Initiated Contact with Patient 10/04/11 1711      Chief Complaint  Patient presents with  . URI    (Consider location/radiation/quality/duration/timing/severity/associated sxs/prior treatment) HPI Comments: 36 year old female here complaining of persistent cough  nonproductive,  nasal congestion, sinus drainage, headache and chills. States she started with cold symptoms about 2 weeks ago was treated with Augmentin for sinusitis. Some of the symptoms have improved like fever and chills, headache and general malaise but the cough is persistent as well as sinus discharge. Feels dizzy during cough spells and has had associated wheezing during cough. Denies fever, chest pain, nausea, vomiting, diarrhea or rash.   Past Medical History  Diagnosis Date  . No pertinent past medical history     Past Surgical History  Procedure Date  . Tooth extraction     Family History  Problem Relation Age of Onset  . Hypertension Father   . Heart disease Maternal Grandmother   . Cancer Maternal Grandfather     History  Substance Use Topics  . Smoking status: Never Smoker   . Smokeless tobacco: Not on file  . Alcohol Use: Yes    OB History    Grav Para Term Preterm Abortions TAB SAB Ect Mult Living   2 2 2  0 0 0 0 0 0 2      Review of Systems  Constitutional: Negative for fever, chills and appetite change.  HENT: Positive for congestion and rhinorrhea. Negative for mouth sores, trouble swallowing and neck pain.   Respiratory: Positive for cough and wheezing. Negative for chest tightness.   Cardiovascular: Negative for chest pain and leg swelling.  Gastrointestinal: Negative for nausea, vomiting, abdominal pain and diarrhea.  Skin: Negative for rash.  Neurological: Negative for headaches.    Allergies  Doxycycline and Aspirin  Home Medications   Current Outpatient Rx  Name Route Sig Dispense Refill  .  ALBUTEROL SULFATE HFA 108 (90 BASE) MCG/ACT IN AERS Inhalation Inhale 1-2 puffs into the lungs every 6 (six) hours as needed for wheezing. 1 Inhaler 0  . AMOXICILLIN 500 MG PO TABS Oral Take 500 mg by mouth 2 (two) times daily.      Marland Kitchen BENZONATATE 100 MG PO CAPS Oral Take 1 capsule (100 mg total) by mouth every 8 (eight) hours. 21 capsule 0  . CETIRIZINE HCL 10 MG PO CAPS Oral Take 1 capsule (10 mg total) by mouth 1 day or 1 dose. 30 capsule 0  . DIPHENHYDRAMINE-APAP (SLEEP) 25-500 MG PO TABS Oral Take 1 tablet by mouth at bedtime as needed. Patient used medication for sleep.     Marland Kitchen FLUTICASONE PROPIONATE 50 MCG/ACT NA SUSP Nasal Place 2 sprays into the nose daily. 16 g 2  . HYDROCODONE-ACETAMINOPHEN 7.5-500 MG/15ML PO SOLN Oral Take 15 mLs by mouth every 8 (eight) hours as needed for pain or cough. 120 mL 0  . PREDNISONE 20 MG PO TABS  2 tabs po daily for 5 days 10 tablet no  . NATALCARE PIC 60-1 MG PO TABS Oral Take 1 tablet by mouth daily with breakfast.     . PRENATAL PLUS 27-1 MG PO TABS Oral Take 1 tablet by mouth daily.      . SUDAFED PO Oral Take 1 tablet by mouth daily as needed.        BP 127/67  Pulse 70  Temp(Src) 97.9 F (36.6 C) (Oral)  Resp  16  SpO2 99%  LMP 09/12/2011  Breastfeeding? No  Physical Exam  Nursing note and vitals reviewed. Constitutional: She is oriented to person, place, and time. She appears well-developed and well-nourished. No distress.  HENT:  Head: Normocephalic and atraumatic.       Nasal Congestion with erythema and swelling of nasal turbinates, clear rhinorrhea. No pharyngeal or exudates.  TM's normal  Eyes: Conjunctivae are normal. Pupils are equal, round, and reactive to light. Right eye exhibits no discharge. Left eye exhibits no discharge.  Neck: Neck supple. No JVD present.  Cardiovascular: Normal heart sounds.   Pulmonary/Chest: Effort normal and breath sounds normal. No respiratory distress. She has no wheezes. She has no rales. She exhibits  no tenderness.  Lymphadenopathy:    She has no cervical adenopathy.  Neurological: She is alert and oriented to person, place, and time.  Skin: No rash noted.    ED Course  Procedures (including critical care time)  Labs Reviewed - No data to display No results found.   1. Persistent cough   2. Chronic rhinitis       MDM  Impress chronic rhinitis. Asked to continue to use Flonase. Prescribed prednisone, cetirizine, Tessalon Perles, hydrocodone and albuterol. Recommend followup with primary care provider or ENT specialist if persistent symptoms despite following treatment.        Sharin Grave, MD 10/05/11 1021

## 2011-10-07 ENCOUNTER — Ambulatory Visit: Payer: BC Managed Care – PPO

## 2011-10-07 ENCOUNTER — Ambulatory Visit (INDEPENDENT_AMBULATORY_CARE_PROVIDER_SITE_OTHER): Payer: BC Managed Care – PPO | Admitting: Family Medicine

## 2011-10-07 VITALS — BP 114/75 | HR 93 | Temp 97.5°F | Resp 20 | Ht 63.75 in | Wt 177.8 lb

## 2011-10-07 DIAGNOSIS — J40 Bronchitis, not specified as acute or chronic: Secondary | ICD-10-CM

## 2011-10-07 DIAGNOSIS — R05 Cough: Secondary | ICD-10-CM

## 2011-10-07 DIAGNOSIS — J329 Chronic sinusitis, unspecified: Secondary | ICD-10-CM

## 2011-10-07 DIAGNOSIS — R059 Cough, unspecified: Secondary | ICD-10-CM

## 2011-10-07 MED ORDER — AZITHROMYCIN 250 MG PO TABS: ORAL_TABLET | ORAL | Status: AC

## 2011-10-07 MED ORDER — BENZONATATE 100 MG PO CAPS: 200.0000 mg | ORAL_CAPSULE | Freq: Two times a day (BID) | ORAL | Status: AC | PRN

## 2011-10-07 MED ORDER — ALBUTEROL SULFATE HFA 108 (90 BASE) MCG/ACT IN AERS: 1.0000 | INHALATION_SPRAY | Freq: Four times a day (QID) | RESPIRATORY_TRACT | Status: DC | PRN

## 2011-10-07 NOTE — Progress Notes (Signed)
[facility]:  Office Visit  Chief Complaint:  Chief Complaint  Patient presents with  . Nasal Congestion  . Cough    5-6 weeks  . Nausea    taste of blood in throat    HPI: Kimberly Fernandez is a 36 y.o. female who complains of 5-6 week h/o sinusitis and rhinitis, cough. Chills. No fevers. No ear pain.  Tried cough medicine without relief, was also given Augmentin 6 weeks ago but sxs persist. Denies asthma or allergies, has mild SOB with excessive exertion but denies wheezing or leg swelling.   Past Medical History  Diagnosis Date  . No pertinent past medical history    Past Surgical History  Procedure Date  . Tooth extraction    History   Social History  . Marital Status: Married    Spouse Name: N/A    Number of Children: N/A  . Years of Education: N/A   Social History Main Topics  . Smoking status: Never Smoker   . Smokeless tobacco: None  . Alcohol Use: Yes  . Drug Use: No  . Sexually Active: Yes    Birth Control/ Protection: None     NFP   Other Topics Concern  . None   Social History Narrative  . None   Family History  Problem Relation Age of Onset  . Hypertension Father   . Heart disease Maternal Grandmother   . Cancer Maternal Grandfather    Allergies  Allergen Reactions  . Doxycycline Rash  . Aspirin    Prior to Admission medications   Medication Sig Start Date End Date Taking? Authorizing Provider  fluticasone (FLONASE) 50 MCG/ACT nasal spray Place 2 sprays into the nose daily. 08/25/11 08/24/12 Yes Richardo Priest, MD  HYDROcodone-acetaminophen (LORTAB) 7.5-500 MG/15ML solution Take 15 mLs by mouth every 8 (eight) hours as needed for pain or cough. 10/04/11 10/14/11 Yes Adlih Moreno-Coll, MD  predniSONE (DELTASONE) 20 MG tablet 2 tabs po daily for 5 days 10/04/11 10/14/11 Yes Adlih Moreno-Coll, MD  Pseudoephedrine HCl (SUDAFED PO) Take 1 tablet by mouth daily as needed.     Yes Historical Provider, MD  albuterol (PROVENTIL HFA;VENTOLIN  HFA) 108 (90 BASE) MCG/ACT inhaler Inhale 1-2 puffs into the lungs every 6 (six) hours as needed for wheezing. 10/04/11 10/03/12  Adlih Moreno-Coll, MD  amoxicillin (AMOXIL) 500 MG tablet Take 500 mg by mouth 2 (two) times daily.      Historical Provider, MD  benzonatate (TESSALON) 100 MG capsule Take 1 capsule (100 mg total) by mouth every 8 (eight) hours. 10/04/11 10/11/11  Adlih Moreno-Coll, MD  Cetirizine HCl 10 MG CAPS Take 1 capsule (10 mg total) by mouth 1 day or 1 dose. 10/04/11   Adlih Moreno-Coll, MD  diphenhydramine-acetaminophen (TYLENOL PM) 25-500 MG TABS Take 1 tablet by mouth at bedtime as needed. Patient used medication for sleep.     Historical Provider, MD  Prenatal Vit-Fe Psac Cmplx-FA (PRENATAL MULTIVITAMIN) 60-1 MG tablet Take 1 tablet by mouth daily with breakfast.     Historical Provider, MD  prenatal vitamin w/FE, FA (PRENATAL 1 + 1) 27-1 MG TABS Take 1 tablet by mouth daily.      Historical Provider, MD     ROS: The patient denies fevers,night sweats, unintentional weight loss, chest pain, palpitations, wheezing, nausea, vomiting, abdominal pain, dysuria, hematuria, melena, numbness, weakness, or tingling. + chills, + sob with exertion  All other systems have been reviewed and were otherwise negative with the exception of those mentioned  in the HPI and as above.    PHYSICAL EXAM: Filed Vitals:   10/07/11 1528  BP: 114/75  Pulse: 93  Temp: 97.5 F (36.4 C)  Resp: 20   Filed Vitals:   10/07/11 1528  Height: 5' 3.75" (1.619 m)  Weight: 177 lb 12.8 oz (80.65 kg)   Body mass index is 30.76 kg/(m^2).  General: Alert, no acute distress HEENT:  Normocephalic, atraumatic, oropharynx patent. TM normal, + max sinus tenderness, OP red but no exudates Cardiovascular:  Regular rate and rhythm, no rubs murmurs or gallops.  No Carotid bruits, radial pulse intact. No pedal edema.  Respiratory: Clear to auscultation bilaterally.  No wheezes, rales, or rhonchi.  No cyanosis, no use  of accessory musculature GI: No organomegaly, abdomen is soft and non-tender, positive bowel sounds.  No masses. Skin: No rashes. Neurologic: Facial musculature symmetric. Psychiatric: Patient is appropriate throughout our interaction. Lymphatic: No cervical lymphadenopathy Musculoskeletal: Gait intact.   EKG/XRAY:   Primary read interpreted by Dr. Conley Rolls at [facility]. No infiltrate, pneumothorax.   ASSESSMENT/PLAN: Encounter Diagnoses  Name Primary?  . Cough Yes  . Rhinosinusitis   . Bronchitis     1. Z-pack, Tessalon Perles, Albuterol prn. Cont with flonase and hyrdromet prn. 2. F/u prn   Kimberly Maves PHUONG, DO 10/07/2011 4:24 PM

## 2011-10-08 NOTE — Patient Instructions (Signed)

## 2012-01-28 ENCOUNTER — Emergency Department (INDEPENDENT_AMBULATORY_CARE_PROVIDER_SITE_OTHER)
Admission: EM | Admit: 2012-01-28 | Discharge: 2012-01-28 | Disposition: A | Discharge status: Home / Self Care | Payer: BC Managed Care – PPO | Source: Home / Self Care | Attending: Emergency Medicine | Admitting: Emergency Medicine

## 2012-01-28 ENCOUNTER — Encounter (HOSPITAL_COMMUNITY): Payer: Self-pay | Admitting: Emergency Medicine

## 2012-01-28 DIAGNOSIS — K219 Gastro-esophageal reflux disease without esophagitis: Secondary | ICD-10-CM

## 2012-01-28 HISTORY — DX: Gastro-esophageal reflux disease without esophagitis: K21.9

## 2012-01-28 HISTORY — DX: Unspecified asthma, uncomplicated: J45.909

## 2012-01-28 HISTORY — DX: Reserved for inherently not codable concepts without codable children: IMO0001

## 2012-01-28 MED ORDER — RANITIDINE HCL 300 MG PO CAPS: 300.0000 mg | ORAL_CAPSULE | Freq: Two times a day (BID) | ORAL | Status: DC

## 2012-01-28 MED ORDER — OMEPRAZOLE 40 MG PO CPDR: 40.0000 mg | DELAYED_RELEASE_CAPSULE | Freq: Every day | ORAL | Status: DC

## 2012-01-28 NOTE — ED Notes (Signed)
Reports having reflux worsening recently.  Reports right abdominal soreness.  Reports nausea today, no vomiting.  Last bm was yesterday, a little loose.  Denies blood in stool

## 2012-01-28 NOTE — ED Provider Notes (Signed)
History     CSN: [id number]  Arrival date & time 01/28/12  1702   First MD Initiated Contact with Patient 01/28/12 1716      Chief Complaint  Patient presents with  . Gastrophageal Reflux    (Consider location/radiation/quality/duration/timing/severity/associated sxs/prior treatment) HPI Comments: Patient with a history of reflux presents with worsening of her chronic reflux. She reports water brash, burning chest sensation, dyspepsia for the past several days. She is currently on Prilosec 20 mg once daily and Zantac 150 mg once daily. She has tried modifying her diet without any success. No aggravating or alleviating factors. No coughing, chest pain, shortness of breath. She also reports intermittent, right upper quadrant discomfort for the past several weeks. It seems to be worse an hour or so after eating. No alleviating factors. t is not affected with exertion, change in position. No radiation to back or to the epigastric region, no fevers, nausea, vomiting, abdominal distention. She reports some loose stools, but no diarrhea, melena, hematochezia. No history of pancreatitis, cholelithiasis.  ROS as noted in HPI. All other ROS negative.   Patient is a 36 y.o. female presenting with GERD. The history is provided by the patient. No language interpreter was used.  Gastrophageal Reflux This is a chronic problem. The current episode started more than 2 days ago. The problem occurs constantly. The problem has not changed since onset.Associated symptoms include abdominal pain. Pertinent negatives include no chest pain. The symptoms are aggravated by eating. Nothing relieves the symptoms.    Past Medical History  Diagnosis Date  . Reflux   . Asthma     Past Surgical History  Procedure Date  . Tooth extraction     Family History  Problem Relation Age of Onset  . Hypertension Father   . Heart disease Maternal Grandmother   . Cancer Maternal Grandfather     History  Substance Use  Topics  . Smoking status: Never Smoker   . Smokeless tobacco: Not on file  . Alcohol Use: Yes     occasional    OB History    Grav Para Term Preterm Abortions TAB SAB Ect Mult Living   2 2 2  0 0 0 0 0 0 2      Review of Systems  Cardiovascular: Negative for chest pain.  Gastrointestinal: Positive for abdominal pain.    Allergies  Doxycycline and Aspirin  Home Medications   Current Outpatient Rx  Name Route Sig Dispense Refill  . ALBUTEROL SULFATE HFA 108 (90 BASE) MCG/ACT IN AERS Inhalation Inhale 1-2 puffs into the lungs every 6 (six) hours as needed for wheezing. 1 Inhaler 0  . CETIRIZINE HCL 10 MG PO CAPS Oral Take 1 capsule (10 mg total) by mouth 1 day or 1 dose. 30 capsule 0  . FLUTICASONE PROPIONATE 50 MCG/ACT NA SUSP Nasal Place 2 sprays into the nose daily. 16 g 2  . OMEPRAZOLE 40 MG PO CPDR Oral Take 1 capsule (40 mg total) by mouth daily. 30 capsule 0  . RANITIDINE HCL 300 MG PO CAPS Oral Take 1 capsule (300 mg total) by mouth 2 (two) times daily. 60 capsule 0    BP 143/89  Pulse 70  Temp 97.4 F (36.3 C) (Oral)  Resp 20  SpO2 99%  LMP 01/24/2012  Breastfeeding? Unknown  Physical Exam  Nursing note and vitals reviewed. Constitutional: She is oriented to person, place, and time. She appears well-developed and well-nourished. No distress.  HENT:  Head: Normocephalic and atraumatic.  Eyes: Conjunctivae and EOM are normal.  Neck: Normal range of motion.  Cardiovascular: Normal rate and regular rhythm.   Pulmonary/Chest: Effort normal and breath sounds normal.  Abdominal: Soft. Normal appearance and bowel sounds are normal. She exhibits no distension and no mass. There is no rigidity, no rebound, no guarding, no tenderness at McBurney's point and negative Murphy's sign.       Right upper quadrant tenderness with deep palpation.  Musculoskeletal: Normal range of motion.  Neurological: She is alert and oriented to person, place, and time.  Skin: Skin is warm  and dry.  Psychiatric: She has a normal mood and affect. Her behavior is normal. Judgment and thought content normal.    ED Course  Procedures (including critical care time)  Labs Reviewed - No data to display No results found.   1. GERD (gastroesophageal reflux disease)     MDM  Patient has some mild right upper quadrant tenderness, but is comfortable. No history of vomiting, fevers. Suspect that she has some biliary disease, no evidence of cholecystitis at this time. She has a primary care physician, I will followup with him to get an ultrasound and further evaluation. Will increase her Prilosec and Zantac to maximum doses. Discussed signs and symptoms that should prompt return to the ED. Patient agrees with plan.    Luiz Blare, MD 01/28/12 2153

## 2012-01-28 NOTE — Discharge Instructions (Signed)
You may need an ultrasound of the right upper quadrant to evaluate for biliary disease. Talk to your primary care physician about this. Due to the ER if you've a fever above 100.4, abdominal distention, if your symptoms get worse despite being on maximal therapy, or other concerns.

## 2012-02-23 ENCOUNTER — Other Ambulatory Visit: Payer: Self-pay | Admitting: Gastroenterology

## 2012-02-23 DIAGNOSIS — R109 Unspecified abdominal pain: Secondary | ICD-10-CM

## 2012-03-02 ENCOUNTER — Other Ambulatory Visit (HOSPITAL_COMMUNITY): Payer: BC Managed Care – PPO

## 2012-04-03 LAB — OB RESULTS CONSOLE RPR: RPR: NONREACTIVE

## 2012-04-15 ENCOUNTER — Emergency Department (HOSPITAL_COMMUNITY)
Admission: EM | Admit: 2012-04-15 | Discharge: 2012-04-15 | Disposition: A | Discharge status: Home / Self Care | Payer: BC Managed Care – PPO | Source: Home / Self Care | Attending: Family Medicine | Admitting: Family Medicine

## 2012-04-15 ENCOUNTER — Encounter (HOSPITAL_COMMUNITY): Payer: Self-pay | Admitting: Emergency Medicine

## 2012-04-15 DIAGNOSIS — J019 Acute sinusitis, unspecified: Secondary | ICD-10-CM

## 2012-04-15 MED ORDER — AMOXICILLIN 500 MG PO CAPS: 500.0000 mg | ORAL_CAPSULE | Freq: Three times a day (TID) | ORAL | Status: AC

## 2012-04-15 NOTE — ED Provider Notes (Signed)
History     CSN: [id number]  Arrival date & time 04/15/12  1107   First MD Initiated Contact with Patient 04/15/12 1111      Chief Complaint  Patient presents with  . Cough    productive cough/green sputum    (Consider location/radiation/quality/duration/timing/severity/associated sxs/prior treatment) Patient is a 36 y.o. female presenting with cough. The history is provided by the patient.  Cough This is a new problem. The current episode started more than 2 days ago. The problem has been gradually worsening. The cough is productive of purulent sputum. There has been no fever. Associated symptoms include rhinorrhea and sore throat. Risk factors: 11 wk preg. She is not a smoker. Past medical history comments: h/o sinusitis,.    Past Medical History  Diagnosis Date  . Reflux   . Asthma     Past Surgical History  Procedure Date  . Tooth extraction     Family History  Problem Relation Age of Onset  . Hypertension Father   . Heart disease Maternal Grandmother   . Cancer Maternal Grandfather     History  Substance Use Topics  . Smoking status: Never Smoker   . Smokeless tobacco: Not on file  . Alcohol Use: Yes     occasional    OB History    Grav Para Term Preterm Abortions TAB SAB Ect Mult Living   3 2 2  0 0 0 0 0 0 2      Review of Systems  Constitutional: Negative.   HENT: Positive for congestion, sore throat, rhinorrhea, postnasal drip and sinus pressure.   Respiratory: Positive for cough.     Allergies  Doxycycline and Aspirin  Home Medications   Current Outpatient Rx  Name Route Sig Dispense Refill  . PRENATAL 27-0.8 MG PO TABS Oral Take 1 tablet by mouth daily.    . ALBUTEROL SULFATE HFA 108 (90 BASE) MCG/ACT IN AERS Inhalation Inhale 1-2 puffs into the lungs every 6 (six) hours as needed for wheezing. 1 Inhaler 0  . AMOXICILLIN 500 MG PO CAPS Oral Take 1 capsule (500 mg total) by mouth 3 (three) times daily. 30 capsule 0  . CETIRIZINE HCL 10 MG  PO CAPS Oral Take 1 capsule (10 mg total) by mouth 1 day or 1 dose. 30 capsule 0  . FLUTICASONE PROPIONATE 50 MCG/ACT NA SUSP Nasal Place 2 sprays into the nose daily. 16 g 2  . OMEPRAZOLE 40 MG PO CPDR Oral Take 1 capsule (40 mg total) by mouth daily. 30 capsule 0  . RANITIDINE HCL 300 MG PO CAPS Oral Take 1 capsule (300 mg total) by mouth 2 (two) times daily. 60 capsule 0    BP 111/65  Pulse 96  Temp 97.9 F (36.6 C) (Oral)  Resp 18  SpO2 99%  LMP 01/24/2012  Physical Exam  Nursing note and vitals reviewed. Constitutional: She is oriented to person, place, and time. She appears well-developed and well-nourished.  HENT:  Head: Normocephalic.  Right Ear: External ear normal.  Left Ear: External ear normal.  Nose: Mucosal edema and rhinorrhea present.  Mouth/Throat: Oropharynx is clear and moist.  Eyes: Conjunctivae are normal. Pupils are equal, round, and reactive to light.  Neck: Normal range of motion. Neck supple.  Cardiovascular: Normal rate and normal heart sounds.   Pulmonary/Chest: Breath sounds normal.  Lymphadenopathy:    She has no cervical adenopathy.  Neurological: She is alert and oriented to person, place, and time.  Skin: Skin is warm and dry.  ED Course  Procedures (including critical care time)  Labs Reviewed - No data to display No results found.   1. Sinusitis, acute       MDM          Linna Hoff, MD 04/15/12 1235

## 2012-04-15 NOTE — ED Notes (Signed)
Pt c/o cough x 4 days with sob. Cough became productive today/green sputum. Some nasal congestion. Lightheaded and dizzy. Pt denies pain. Pt is [redacted] wks pregnant.

## 2012-07-10 ENCOUNTER — Ambulatory Visit (INDEPENDENT_AMBULATORY_CARE_PROVIDER_SITE_OTHER): Payer: BC Managed Care – PPO | Admitting: Family Medicine

## 2012-07-10 VITALS — BP 114/62 | HR 83 | Temp 97.7°F | Resp 18 | Ht 64.0 in | Wt 186.0 lb

## 2012-07-10 DIAGNOSIS — J329 Chronic sinusitis, unspecified: Secondary | ICD-10-CM

## 2012-07-10 DIAGNOSIS — J310 Chronic rhinitis: Secondary | ICD-10-CM

## 2012-07-10 LAB — POCT CBC
Granulocyte percent: 76.4 %G (ref 37–80)
HCT, POC: 37.6 % — AB (ref 37.7–47.9)
Hemoglobin: 11.8 g/dL — AB (ref 12.2–16.2)
MCV: 93.1 fL (ref 80–97)
POC Granulocyte: 9.6 — AB (ref 2–6.9)
RBC: 4.04 M/uL (ref 4.04–5.48)

## 2012-07-10 MED ORDER — IPRATROPIUM BROMIDE 0.03 % NA SOLN: 2.0000 | Freq: Two times a day (BID) | NASAL | Status: DC

## 2012-07-10 MED ORDER — FLUTICASONE PROPIONATE 50 MCG/ACT NA SUSP: 2.0000 | Freq: Every day | NASAL | Status: DC

## 2012-07-10 MED ORDER — CETIRIZINE HCL 10 MG PO CAPS: 1.0000 | ORAL_CAPSULE | Freq: Every day | ORAL | Status: DC

## 2012-07-10 NOTE — Progress Notes (Signed)
Subjective:    Patient ID: Kimberly Fernandez, female    DOB: 1976-05-23, 36 y.o.   MRN: [id number]  HPI  Chronic congestion complicated by GERD and recurrent sinusitis.  Has nevery seen ENT or had imaging of sinuses for this.  Has been having intermittent fluid/popping sounds between saw Dr. Suzie Portela on 10/17 treated with azithromycin after a course of amoxicillin.  About 2-3 wks after the zpack her cong came back, stopped swimming, using ear plugs but still muffled and wavy but that went away and past few days has been dizzy - had to stop working as she feels off balance and feeling lightheaded.  Is constant since Sun (was intermittatnt Sat,) no vertigo.  No f/c, very thick congestion.  Is using nasal saline only. No decongestants.  Had allergy testing done and they didn't find anything.  Doe shave humidifier. Has never used a nettipot or sinus rinse.  Past Medical History  Diagnosis Date  . Reflux   . Asthma     Review of Systems  Constitutional: Positive for fatigue. Negative for fever, chills, diaphoresis, activity change and appetite change.  HENT: Positive for congestion, sore throat, rhinorrhea, sneezing, postnasal drip and sinus pressure. Negative for ear pain, nosebleeds, trouble swallowing, neck pain, neck stiffness, voice change and ear discharge.   Eyes: Negative for discharge and itching.  Respiratory: Negative for cough and shortness of breath.   Cardiovascular: Negative for chest pain.  Gastrointestinal: Negative for nausea, vomiting and abdominal pain.  Skin: Negative for rash.  Neurological: Positive for light-headedness and headaches. Negative for dizziness and syncope.  Hematological: Negative for adenopathy.  Psychiatric/Behavioral: Positive for sleep disturbance.      BP 114/62  Pulse 83  Temp 97.7 F (36.5 C) (Oral)  Resp 18  Ht 5\' 4"  (1.626 m)  Wt 186 lb (84.369 kg)  BMI 31.93 kg/m2  SpO2 100%  LMP 01/24/2012 Objective:   Physical Exam  Constitutional: She is  oriented to person, place, and time. She appears well-developed and well-nourished. No distress.  HENT:  Head: Normocephalic and atraumatic.  Right Ear: Tympanic membrane, external ear and ear canal normal.  Left Ear: Tympanic membrane, external ear and ear canal normal.  Nose: Mucosal edema and rhinorrhea present. Right sinus exhibits maxillary sinus tenderness. Left sinus exhibits maxillary sinus tenderness.  Mouth/Throat: Uvula is midline and mucous membranes are normal. Posterior oropharyngeal edema present. No oropharyngeal exudate, posterior oropharyngeal erythema or tonsillar abscesses.  Eyes: Conjunctivae normal are normal. Right eye exhibits no discharge. Left eye exhibits no discharge. No scleral icterus.  Neck: Normal range of motion. Neck supple.  Cardiovascular: Normal rate, regular rhythm and intact distal pulses.   Murmur heard.  Systolic murmur is present with a grade of 2/6  Pulmonary/Chest: Effort normal and breath sounds normal.  Lymphadenopathy:       Head (right side): Submandibular adenopathy present. No preauricular and no posterior auricular adenopathy present.       Head (left side): Submandibular adenopathy present. No preauricular and no posterior auricular adenopathy present.    She has no cervical adenopathy.       Right: No supraclavicular adenopathy present.       Left: No supraclavicular adenopathy present.  Neurological: She is alert and oriented to person, place, and time.  Skin: Skin is warm and dry. She is not diaphoretic. No erythema.  Psychiatric: She has a normal mood and affect. Her behavior is normal.      Results for orders placed in visit on 07/10/12  POCT CBC      Component Value Range   WBC 12.6 (*) 4.6 - 10.2 K/uL   Lymph, poc 2.5  0.6 - 3.4   POC LYMPH PERCENT 19.6  10 - 50 %L   MID (cbc) 0.5  0 - 0.9   POC MID % 4.0  0 - 12 %M   POC Granulocyte 9.6 (*) 2 - 6.9   Granulocyte percent 76.4  37 - 80 %G   RBC 4.04  4.04 - 5.48 M/uL    Hemoglobin 11.8 (*) 12.2 - 16.2 g/dL   HCT, POC 16.1 (*) 09.6 - 47.9 %   MCV 93.1  80 - 97 fL   MCH, POC 29.2  27 - 31.2 pg   MCHC 31.4 (*) 31.8 - 35.4 g/dL   RDW, POC 04.5     Platelet Count, POC 206  142 - 424 K/uL   MPV 9.3  0 - 99.8 fL    Assessment & Plan:   1. Sinusitis  POCT CBC  2. Rhinitis  ipratropium (ATROVENT) 0.03 % nasal spray, fluticasone (FLONASE) 50 MCG/ACT nasal spray, Cetirizine HCl 10 MG CAPS  flonase should be safe to use as their is minimal systemic absorption. Start netti pot or sinus rinse.  I am unsure of pt's cause of lightheadedness  - hopefully it will resolve w/ treatment of her congesiton. Push fluids and rtc if worsens.  Mumur is prob due to preg, recheck after devliery  After delivery, I recommend sinus ct scan and ENT referral for more definitive treatment as has had chronic prob for years.

## 2012-08-15 NOTE — L&D Delivery Note (Signed)
Operative Delivery Note At 6:13 PM a viable female was delivered via Vaginal, Spontaneous Delivery.  Presentation: vertex; Position: Right,, Occiput,, Anterior; Station: +3.  Delivery of the head: 6:12 First maneuver: 10/22/2012  6:12 PM, McRoberts Second maneuver: 10/22/2012  6:13 PM, Suprapubic Pressure Baby delivered OA but restituted to OP.  Right shoulder posterior and performed above with posterior axillary lift and finally delivery of posterior shoulder followed by left anterior.  Baby floppy so NICU attended delivery but baby quickly responded to stimulation and was moving both arms well.     Verbal consent: obtained from patient.  APGAR: 5, 8; weight pending .   Placenta status: Intact, Spontaneous.   Cord: 3 vessels with the following complications: None.   Anesthesia: Epidural  Episiotomy: None Lacerations: 2nd degree;Perineal Suture Repair: 2.0 vicryl Est. Blood Loss (mL): 350cc  Mom to postpartum.  Baby to stay with mother. Parents decline circumcision.  Oliver Pila 10/22/2012, 6:38 PM

## 2012-09-30 ENCOUNTER — Inpatient Hospital Stay (HOSPITAL_COMMUNITY)
Admission: AD | Admit: 2012-09-30 | Discharge: 2012-09-30 | Disposition: A | Discharge status: Home / Self Care | Payer: BC Managed Care – PPO | Source: Ambulatory Visit | Attending: Obstetrics and Gynecology | Admitting: Obstetrics and Gynecology

## 2012-09-30 ENCOUNTER — Encounter (HOSPITAL_COMMUNITY): Payer: Self-pay | Admitting: Obstetrics and Gynecology

## 2012-09-30 DIAGNOSIS — O133 Gestational [pregnancy-induced] hypertension without significant proteinuria, third trimester: Secondary | ICD-10-CM

## 2012-09-30 DIAGNOSIS — O139 Gestational [pregnancy-induced] hypertension without significant proteinuria, unspecified trimester: Secondary | ICD-10-CM | POA: Insufficient documentation

## 2012-09-30 DIAGNOSIS — O99891 Other specified diseases and conditions complicating pregnancy: Secondary | ICD-10-CM | POA: Insufficient documentation

## 2012-09-30 DIAGNOSIS — E876 Hypokalemia: Secondary | ICD-10-CM

## 2012-09-30 DIAGNOSIS — R42 Dizziness and giddiness: Secondary | ICD-10-CM | POA: Insufficient documentation

## 2012-09-30 LAB — CBC
MCH: 28.3 pg (ref 26.0–34.0)
MCHC: 33.6 g/dL (ref 30.0–36.0)
MCV: 84.2 fL (ref 78.0–100.0)
Platelets: 128 10*3/uL — ABNORMAL LOW (ref 150–400)

## 2012-09-30 LAB — COMPREHENSIVE METABOLIC PANEL
ALT: 8 U/L (ref 0–35)
AST: 17 U/L (ref 0–37)
CO2: 21 mEq/L (ref 19–32)
Calcium: 8.5 mg/dL (ref 8.4–10.5)
Creatinine, Ser: 0.5 mg/dL (ref 0.50–1.10)
GFR calc Af Amer: >90 mL/min (ref >90)
GFR calc non Af Amer: >90 mL/min (ref >90)
Glucose, Bld: 75 mg/dL (ref 70–99)
Sodium: 134 mEq/L — ABNORMAL LOW (ref 135–145)
Total Protein: 5.7 g/dL — ABNORMAL LOW (ref 6.0–8.3)

## 2012-09-30 LAB — URINALYSIS, ROUTINE W REFLEX MICROSCOPIC
Bilirubin Urine: NEGATIVE
Nitrite: NEGATIVE
Specific Gravity, Urine: <1.005 — ABNORMAL LOW (ref 1.005–1.030)
Urobilinogen, UA: 0.2 mg/dL (ref 0.0–1.0)
pH: 6 (ref 5.0–8.0)

## 2012-09-30 LAB — LACTATE DEHYDROGENASE: LDH: 200 U/L (ref 94–250)

## 2012-09-30 LAB — URIC ACID: Uric Acid, Serum: 6 mg/dL (ref 2.4–7.0)

## 2012-09-30 MED ORDER — POTASSIUM CHLORIDE 10 MEQ/100ML IV SOLN
10.0000 meq | INTRAVENOUS | Status: AC
  Administered 2012-09-30: 10 meq via INTRAVENOUS
  Filled 2012-09-30 (×2): qty 100

## 2012-09-30 MED ORDER — POTASSIUM CHLORIDE ER 10 MEQ PO TBCR: 20.0000 meq | EXTENDED_RELEASE_TABLET | Freq: Two times a day (BID) | ORAL | Status: DC

## 2012-09-30 MED ORDER — ONDANSETRON 8 MG PO TBDP
8.0000 mg | ORAL_TABLET | Freq: Once | ORAL | Status: AC
  Administered 2012-09-30: 8 mg via ORAL
  Filled 2012-09-30: qty 1

## 2012-09-30 MED ORDER — SODIUM CHLORIDE 0.9 % IV SOLN
Freq: Once | INTRAVENOUS | Status: AC
  Administered 2012-09-30: 13:00:00 via INTRAVENOUS

## 2012-09-30 MED ORDER — POTASSIUM CHLORIDE 10 MEQ/100ML IV SOLN
10.0000 meq | Freq: Once | INTRAVENOUS | Status: DC
  Filled 2012-09-30: qty 100

## 2012-09-30 NOTE — MAU Provider Note (Signed)
History     CSN: [id number]  Arrival date and time: 09/30/12 1108   None     Chief Complaint  Patient presents with  . Blurred Vision  . Dizziness  . Leg Swelling   HPI 37 y.o. G3P2002 at [redacted]w[redacted]d with nausea, lightheadedness, blurred vision starting today. Elevated BPs at home (140s/90s).  + fetal  Movement, no contractions, bleeding, LOF. Normal prenatal course. H/O postpartum preeclampsia with last pregnancy and PIH with first pregnancy.   Past Medical History  Diagnosis Date  . Reflux   . Asthma   . Hypertension     Past Surgical History  Procedure Laterality Date  . Tooth extraction    . Upper gastrointestinal endoscopy      Family History  Problem Relation Age of Onset  . Hypertension Father   . Cancer Father     lung  . Heart disease Maternal Grandmother   . Cancer Maternal Grandfather     History  Substance Use Topics  . Smoking status: Never Smoker   . Smokeless tobacco: Not on file  . Alcohol Use: Yes     Comment: occasional    Allergies:  Allergies  Allergen Reactions  . Doxycycline Rash  . Aspirin     Prescriptions prior to admission  Medication Sig Dispense Refill  . Cetirizine HCl 10 MG CAPS Take 1 capsule (10 mg total) by mouth daily.  30 capsule  5  . fluticasone (FLONASE) 50 MCG/ACT nasal spray Place 2 sprays into the nose daily.  16 g  2  . pantoprazole (PROTONIX) 20 MG tablet Take 20 mg by mouth daily.      . Prenatal Vit-Fe Fumarate-FA (MULTIVITAMIN-PRENATAL) 27-0.8 MG TABS Take 1 tablet by mouth daily.        Review of Systems  Constitutional: Negative.   Eyes: Positive for blurred vision.  Respiratory: Negative.   Cardiovascular: Negative.   Gastrointestinal: Positive for nausea. Negative for vomiting, abdominal pain, diarrhea and constipation.  Genitourinary: Negative for dysuria, urgency, frequency, hematuria and flank pain.       Negative for vaginal bleeding, + cramping, no contractions  Musculoskeletal: Negative.    Neurological: Positive for dizziness and headaches.  Psychiatric/Behavioral: Negative.    Physical Exam   Blood pressure 140/83, pulse 101, temperature 97.6 F (36.4 C), temperature source Oral, resp. rate 20, last menstrual period 01/24/2012, unknown if currently breastfeeding.  Physical Exam  Nursing note and vitals reviewed. Constitutional: She is oriented to person, place, and time. She appears well-developed and well-nourished. No distress.  Cardiovascular: Normal rate.   Respiratory: Effort normal.  GI: Soft. There is no tenderness.  Musculoskeletal: Normal range of motion.  Neurological: She is alert and oriented to person, place, and time. She has normal reflexes.  Neg clonus   Skin: Skin is warm and dry.  Psychiatric: She has a normal mood and affect.    MAU Course  Procedures  Results for orders placed during the hospital encounter of 09/30/12 (from the past 24 hour(s))  URINALYSIS, ROUTINE W REFLEX MICROSCOPIC     Status: Abnormal   Collection Time    09/30/12 11:20 AM      Result Value Range   Color, Urine YELLOW  YELLOW   APPearance CLEAR  CLEAR   Specific Gravity, Urine <1.005 (*) 1.005 - 1.030   pH 6.0  5.0 - 8.0   Glucose, UA NEGATIVE  NEGATIVE mg/dL   Hgb urine dipstick NEGATIVE  NEGATIVE   Bilirubin Urine NEGATIVE  NEGATIVE  Ketones, ur NEGATIVE  NEGATIVE mg/dL   Protein, ur NEGATIVE  NEGATIVE mg/dL   Urobilinogen, UA 0.2  0.0 - 1.0 mg/dL   Nitrite NEGATIVE  NEGATIVE   Leukocytes, UA NEGATIVE  NEGATIVE  CBC     Status: Abnormal   Collection Time    09/30/12 11:33 AM      Result Value Range   WBC 10.0  4.0 - 10.5 K/uL   RBC 3.92  3.87 - 5.11 MIL/uL   Hemoglobin 11.1 (*) 12.0 - 15.0 g/dL   HCT 78.2 (*) 95.6 - 21.3 %   MCV 84.2  78.0 - 100.0 fL   MCH 28.3  26.0 - 34.0 pg   MCHC 33.6  30.0 - 36.0 g/dL   RDW 08.6  57.8 - 46.9 %   Platelets 128 (*) 150 - 400 K/uL  COMPREHENSIVE METABOLIC PANEL     Status: Abnormal   Collection Time    09/30/12  11:33 AM      Result Value Range   Sodium 134 (*) 135 - 145 mEq/L   Potassium 2.7 (*) 3.5 - 5.1 mEq/L   Chloride 101  96 - 112 mEq/L   CO2 21  19 - 32 mEq/L   Glucose, Bld 75  70 - 99 mg/dL   BUN 4 (*) 6 - 23 mg/dL   Creatinine, Ser 6.29  0.50 - 1.10 mg/dL   Calcium 8.5  8.4 - 52.8 mg/dL   Total Protein 5.7 (*) 6.0 - 8.3 g/dL   Albumin 2.5 (*) 3.5 - 5.2 g/dL   AST 17  0 - 37 U/L   ALT 8  0 - 35 U/L   Alkaline Phosphatase 123 (*) 39 - 117 U/L   Total Bilirubin 0.4  0.3 - 1.2 mg/dL   GFR calc non Af Amer >90  >90 mL/min   GFR calc Af Amer >90  >90 mL/min  URIC ACID     Status: None   Collection Time    09/30/12 11:33 AM      Result Value Range   Uric Acid, Serum 6.0  2.4 - 7.0 mg/dL  LACTATE DEHYDROGENASE     Status: None   Collection Time    09/30/12 11:33 AM      Result Value Range   LDH 200  94 - 250 U/L   Meds ordered this encounter  Medications  . ondansetron (ZOFRAN-ODT) disintegrating tablet 8 mg    Sig:   . DISCONTD: potassium chloride 10 mEq in 100 mL IVPB    Sig:   . potassium chloride 10 mEq in 100 mL IVPB    Sig:   . 0.9 %  sodium chloride infusion    Sig:   . potassium chloride (K-DUR) 10 MEQ tablet    Sig: Take 2 tablets (20 mEq total) by mouth 2 (two) times daily.    Dispense:  60 tablet    Refill:  2    Order Specific Question:  Supervising Provider    Answer:  Duane Lope H [[id number]]    Assessment and Plan   1. Hypokalemia   2. PIH (pregnancy induced hypertension), antepartum, third trimester       Medication List    TAKE these medications       Cetirizine HCl 10 MG Caps  Take 1 capsule (10 mg total) by mouth daily.     fluticasone 50 MCG/ACT nasal spray  Commonly known as:  FLONASE  Place 2 sprays into the nose daily.  multivitamin-prenatal 27-0.8 MG Tabs  Take 1 tablet by mouth daily.     pantoprazole 20 MG tablet  Commonly known as:  PROTONIX  Take 20 mg by mouth daily.     potassium chloride 10 MEQ tablet  Commonly known as:   K-DUR  Take 2 tablets (20 mEq total) by mouth 2 (two) times daily.            Follow-up Information   Follow up with BOVARD,JODY, MD. (this week for recheck of CBC)    Contact information:   [address] [place] [address] [phone]         FRAZIER,NATALIE 09/30/2012, 11:51 AM

## 2012-09-30 NOTE — Progress Notes (Signed)
CRITICAL VALUE ALERT  Critical value received:  Potassium Level = 2.7  Date of notification:  09/30/2012 Time of notification:  12:18 PM  Critical value read back:yes  Nurse who received alert:  Raelyn Mora, RN MD notified (1st page):  Georges Mouse, CNM Time of first page:  12:19 PM MD notified (2nd page):  Time of second page:  Responding MD:    Time MD responded:

## 2012-10-01 ENCOUNTER — Other Ambulatory Visit: Payer: Self-pay | Admitting: Advanced Practice Midwife

## 2012-10-01 LAB — OB RESULTS CONSOLE GBS: GBS: NEGATIVE

## 2012-10-01 MED ORDER — POTASSIUM CHLORIDE ER 10 MEQ PO TBCR: 20.0000 meq | EXTENDED_RELEASE_TABLET | Freq: Two times a day (BID) | ORAL | Status: DC

## 2012-10-16 ENCOUNTER — Telehealth (HOSPITAL_COMMUNITY): Payer: Self-pay | Admitting: *Deleted

## 2012-10-16 ENCOUNTER — Encounter (HOSPITAL_COMMUNITY): Payer: Self-pay | Admitting: *Deleted

## 2012-10-16 NOTE — Telephone Encounter (Signed)
Preadmission screen  

## 2012-10-21 NOTE — H&P (Signed)
Kimberly Fernandez is a 37 y.o. female G3P2002 at 73 6/7 weeks (EDD 10/30/12 by LMP c/w 9 week [place]) presenting for IOL given ongoing issues with elevated BP since 35 weeks.  Pt has been followed closely with NST's, labs and was taken out of work.  She reports BP at home 140-150-90-100.  24 hour urine done one week prior to admission was WNL at 245mg .  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  Maternal Medical History:  Contractions: Frequency: irregular.   Perceived severity is mild.    Fetal activity: Perceived fetal activity is normal.    Prenatal complications: PIH.     OB History   Grav Para Term Preterm Abortions TAB SAB Ect Mult Living   3 2 2  0 0 0 0 0 0 2    2009 vacuum assisted vaginal delivery 6#8oz 2012 NSVD 6#8oz  Past Medical History  Diagnosis Date  . Reflux   . Asthma   . Hypertension   . Albinism   . Nystagmus   . Depression   . Pregnancy induced hypertension    Past Surgical History  Procedure Laterality Date  . Tooth extraction    . Upper gastrointestinal endoscopy     Family History: family history includes Cancer in her father and maternal grandfather; Heart disease in her maternal grandmother; and Hypertension in her father. Social History:  reports that she has never smoked. She has never used smokeless tobacco. She reports that  drinks alcohol. She reports that she does not use illicit drugs.   Prenatal Transfer Tool  Maternal Diabetes: No Genetic Screening: Normal Maternal Ultrasounds/Referrals: Normal Fetal Ultrasounds or other Referrals:  None Maternal Substance Abuse:  No Significant Maternal Medications:  None Significant Maternal Lab Results:  None Other Comments:  Pt with albinism, other children not affected  Review of Systems  Neurological: Positive for headaches.      Last menstrual period 01/24/2012, unknown if currently breastfeeding. Maternal Exam:  Uterine  Assessment: Contraction strength is mild.  Contraction frequency is irregular.   Abdomen: Patient reports no abdominal tenderness. Fetal presentation: vertex  Introitus: Normal vulva. Normal vagina.    Physical Exam  Constitutional: She is oriented to person, place, and time. She appears well-developed and well-nourished.  Cardiovascular: Normal rate and regular rhythm.   Respiratory: Effort normal and breath sounds normal.  GI: Soft.  Genitourinary: Vagina normal.  Uterus gravid  EFW 6 1/2-7lbs  Neurological: She is alert and oriented to person, place, and time.  Psychiatric: She has a normal mood and affect. Her behavior is normal.    Prenatal labs: ABO, Rh:  O negative Antibody:  negative Rubella:  Immune RPR: Nonreactive (08/20 0000)  HBsAg: Negative (08/20 0000)  HIV: Non-reactive (08/20 0000)  GBS: Negative (02/17 0000)  First trimester screen and AFP WNL One hour glucola 105  Assessment/Plan: Pt for IOL given increased BP since about [redacted] weeks gestation.  Labs and 24 hour urine r/o preeclampsia, but pt with persistent BP 140-150-90's and intermittent HA.  Plan pitocin and AROM.  Oliver Pila 10/21/2012, 6:00 PM

## 2012-10-22 ENCOUNTER — Inpatient Hospital Stay (HOSPITAL_COMMUNITY)
Admission: RE | Admit: 2012-10-22 | Discharge: 2012-10-24 | DRG: 372 | Disposition: A | Discharge status: Home / Self Care | Payer: BC Managed Care – PPO | Source: Ambulatory Visit | Attending: Obstetrics and Gynecology | Admitting: Obstetrics and Gynecology

## 2012-10-22 ENCOUNTER — Encounter (HOSPITAL_COMMUNITY): Payer: Self-pay | Admitting: Anesthesiology

## 2012-10-22 ENCOUNTER — Inpatient Hospital Stay (HOSPITAL_COMMUNITY): Payer: BC Managed Care – PPO | Admitting: Anesthesiology

## 2012-10-22 ENCOUNTER — Encounter (HOSPITAL_COMMUNITY): Payer: Self-pay

## 2012-10-22 VITALS — BP 135/83 | HR 63 | Temp 97.4°F | Resp 18 | Ht 64.0 in | Wt 203.0 lb

## 2012-10-22 DIAGNOSIS — O133 Gestational [pregnancy-induced] hypertension without significant proteinuria, third trimester: Secondary | ICD-10-CM

## 2012-10-22 DIAGNOSIS — O139 Gestational [pregnancy-induced] hypertension without significant proteinuria, unspecified trimester: Principal | ICD-10-CM | POA: Diagnosis present

## 2012-10-22 LAB — CBC
Hemoglobin: 11.4 g/dL — ABNORMAL LOW (ref 12.0–15.0)
Hemoglobin: 11.3 g/dL — ABNORMAL LOW (ref 12.0–15.0)
Hemoglobin: 10.9 g/dL — ABNORMAL LOW (ref 12.0–15.0)
MCH: 28.5 pg (ref 26.0–34.0)
MCHC: 33.5 g/dL (ref 30.0–36.0)
Platelets: 125 10*3/uL — ABNORMAL LOW (ref 150–400)
Platelets: 132 10*3/uL — ABNORMAL LOW (ref 150–400)
RBC: 4 MIL/uL (ref 3.87–5.11)
RBC: 3.99 MIL/uL (ref 3.87–5.11)
RBC: 3.82 MIL/uL — ABNORMAL LOW (ref 3.87–5.11)
WBC: 11.5 10*3/uL — ABNORMAL HIGH (ref 4.0–10.5)
WBC: 15.4 10*3/uL — ABNORMAL HIGH (ref 4.0–10.5)

## 2012-10-22 LAB — COMPREHENSIVE METABOLIC PANEL
ALT: 9 U/L (ref 0–35)
AST: 18 U/L (ref 0–37)
CO2: 18 mEq/L — ABNORMAL LOW (ref 19–32)
Calcium: 8.8 mg/dL (ref 8.4–10.5)
Chloride: 103 mEq/L (ref 96–112)
GFR calc non Af Amer: >90 mL/min (ref >90)
Sodium: 134 mEq/L — ABNORMAL LOW (ref 135–145)
Total Bilirubin: 0.4 mg/dL (ref 0.3–1.2)

## 2012-10-22 LAB — RPR: RPR Ser Ql: NONREACTIVE

## 2012-10-22 MED ORDER — WITCH HAZEL-GLYCERIN EX PADS
1.0000 "application " | MEDICATED_PAD | CUTANEOUS | Status: DC | PRN
  Administered 2012-10-22: 1 via TOPICAL

## 2012-10-22 MED ORDER — PHENYLEPHRINE 40 MCG/ML (10ML) SYRINGE FOR IV PUSH (FOR BLOOD PRESSURE SUPPORT)
80.0000 ug | PREFILLED_SYRINGE | INTRAVENOUS | Status: DC | PRN
  Filled 2012-10-22: qty 5

## 2012-10-22 MED ORDER — OXYTOCIN 40 UNITS IN LACTATED RINGERS INFUSION - SIMPLE MED
1.0000 m[IU]/min | INTRAVENOUS | Status: DC
  Administered 2012-10-22: 2 m[IU]/min via INTRAVENOUS
  Filled 2012-10-22: qty 1000

## 2012-10-22 MED ORDER — PHENYLEPHRINE 40 MCG/ML (10ML) SYRINGE FOR IV PUSH (FOR BLOOD PRESSURE SUPPORT): 80.0000 ug | PREFILLED_SYRINGE | INTRAVENOUS | Status: DC | PRN

## 2012-10-22 MED ORDER — LANOLIN HYDROUS EX OINT: TOPICAL_OINTMENT | CUTANEOUS | Status: DC | PRN

## 2012-10-22 MED ORDER — EPHEDRINE 5 MG/ML INJ
10.0000 mg | INTRAVENOUS | Status: DC | PRN
  Filled 2012-10-22: qty 4

## 2012-10-22 MED ORDER — ZOLPIDEM TARTRATE 5 MG PO TABS: 5.0000 mg | ORAL_TABLET | Freq: Every evening | ORAL | Status: DC | PRN

## 2012-10-22 MED ORDER — ONDANSETRON HCL 4 MG/2ML IJ SOLN: 4.0000 mg | INTRAMUSCULAR | Status: DC | PRN

## 2012-10-22 MED ORDER — PRENATAL MULTIVITAMIN CH
1.0000 | ORAL_TABLET | Freq: Every day | ORAL | Status: DC
  Administered 2012-10-23: 1 via ORAL
  Filled 2012-10-22: qty 1

## 2012-10-22 MED ORDER — DIPHENHYDRAMINE HCL 50 MG/ML IJ SOLN: 12.5000 mg | INTRAMUSCULAR | Status: DC | PRN

## 2012-10-22 MED ORDER — BUTORPHANOL TARTRATE 1 MG/ML IJ SOLN
1.0000 mg | INTRAMUSCULAR | Status: DC | PRN
  Administered 2012-10-22: 1 mg via INTRAVENOUS
  Filled 2012-10-22: qty 1

## 2012-10-22 MED ORDER — OXYTOCIN 40 UNITS IN LACTATED RINGERS INFUSION - SIMPLE MED: 62.5000 mL/h | INTRAVENOUS | Status: DC

## 2012-10-22 MED ORDER — IBUPROFEN 600 MG PO TABS
600.0000 mg | ORAL_TABLET | Freq: Four times a day (QID) | ORAL | Status: DC
  Administered 2012-10-22 – 2012-10-24 (×6): 600 mg via ORAL
  Filled 2012-10-22 (×6): qty 1

## 2012-10-22 MED ORDER — POTASSIUM CHLORIDE ER 10 MEQ PO TBCR
20.0000 meq | EXTENDED_RELEASE_TABLET | Freq: Two times a day (BID) | ORAL | Status: DC
  Filled 2012-10-22 (×2): qty 2

## 2012-10-22 MED ORDER — TETANUS-DIPHTH-ACELL PERTUSSIS 5-2.5-18.5 LF-MCG/0.5 IM SUSP: 0.5000 mL | Freq: Once | INTRAMUSCULAR | Status: DC

## 2012-10-22 MED ORDER — DIBUCAINE 1 % RE OINT: 1.0000 "application " | TOPICAL_OINTMENT | RECTAL | Status: DC | PRN

## 2012-10-22 MED ORDER — PRENATAL MULTIVITAMIN CH: 1.0000 | ORAL_TABLET | Freq: Every day | ORAL | Status: DC

## 2012-10-22 MED ORDER — OXYCODONE-ACETAMINOPHEN 5-325 MG PO TABS
1.0000 | ORAL_TABLET | ORAL | Status: DC | PRN
  Administered 2012-10-23: 1 via ORAL
  Filled 2012-10-22: qty 1

## 2012-10-22 MED ORDER — DIPHENHYDRAMINE HCL 25 MG PO CAPS: 25.0000 mg | ORAL_CAPSULE | Freq: Four times a day (QID) | ORAL | Status: DC | PRN

## 2012-10-22 MED ORDER — PANTOPRAZOLE SODIUM 20 MG PO TBEC
20.0000 mg | DELAYED_RELEASE_TABLET | Freq: Every day | ORAL | Status: DC
  Administered 2012-10-23 – 2012-10-24 (×2): 20 mg via ORAL
  Filled 2012-10-22 (×2): qty 1

## 2012-10-22 MED ORDER — SIMETHICONE 80 MG PO CHEW: 80.0000 mg | CHEWABLE_TABLET | ORAL | Status: DC | PRN

## 2012-10-22 MED ORDER — POTASSIUM CHLORIDE CRYS ER 20 MEQ PO TBCR
20.0000 meq | EXTENDED_RELEASE_TABLET | Freq: Two times a day (BID) | ORAL | Status: DC
  Administered 2012-10-22 – 2012-10-24 (×4): 20 meq via ORAL
  Filled 2012-10-22 (×4): qty 1

## 2012-10-22 MED ORDER — FENTANYL 2.5 MCG/ML BUPIVACAINE 1/10 % EPIDURAL INFUSION (WH - ANES): 14.0000 mL/h | INTRAMUSCULAR | Status: DC | PRN

## 2012-10-22 MED ORDER — ONDANSETRON HCL 4 MG/2ML IJ SOLN: 4.0000 mg | Freq: Four times a day (QID) | INTRAMUSCULAR | Status: DC | PRN

## 2012-10-22 MED ORDER — CITRIC ACID-SODIUM CITRATE 334-500 MG/5ML PO SOLN: 30.0000 mL | ORAL | Status: DC | PRN

## 2012-10-22 MED ORDER — FENTANYL 2.5 MCG/ML BUPIVACAINE 1/10 % EPIDURAL INFUSION (WH - ANES)
INTRAMUSCULAR | Status: DC | PRN
  Administered 2012-10-22: 14 mL/h via EPIDURAL

## 2012-10-22 MED ORDER — LACTATED RINGERS IV SOLN: 500.0000 mL | INTRAVENOUS | Status: DC | PRN

## 2012-10-22 MED ORDER — PROMETHAZINE HCL 25 MG/ML IJ SOLN
12.5000 mg | Freq: Four times a day (QID) | INTRAMUSCULAR | Status: DC | PRN
  Administered 2012-10-22: 12.5 mg via INTRAVENOUS
  Filled 2012-10-22: qty 1

## 2012-10-22 MED ORDER — BENZOCAINE-MENTHOL 20-0.5 % EX AERO
1.0000 "application " | INHALATION_SPRAY | CUTANEOUS | Status: DC | PRN
  Administered 2012-10-22: 1 via TOPICAL
  Filled 2012-10-22: qty 56

## 2012-10-22 MED ORDER — LIDOCAINE HCL (PF) 1 % IJ SOLN
30.0000 mL | INTRAMUSCULAR | Status: DC | PRN
  Administered 2012-10-22: 30 mL via SUBCUTANEOUS
  Filled 2012-10-22 (×2): qty 30

## 2012-10-22 MED ORDER — IBUPROFEN 600 MG PO TABS: 600.0000 mg | ORAL_TABLET | Freq: Four times a day (QID) | ORAL | Status: DC | PRN

## 2012-10-22 MED ORDER — FENTANYL 2.5 MCG/ML BUPIVACAINE 1/10 % EPIDURAL INFUSION (WH - ANES)
14.0000 mL/h | INTRAMUSCULAR | Status: DC
  Filled 2012-10-22: qty 125

## 2012-10-22 MED ORDER — LORATADINE 10 MG PO TABS
10.0000 mg | ORAL_TABLET | Freq: Every day | ORAL | Status: DC
  Administered 2012-10-23 – 2012-10-24 (×2): 10 mg via ORAL
  Filled 2012-10-22 (×2): qty 1

## 2012-10-22 MED ORDER — OXYCODONE-ACETAMINOPHEN 5-325 MG PO TABS: 1.0000 | ORAL_TABLET | ORAL | Status: DC | PRN

## 2012-10-22 MED ORDER — ACETAMINOPHEN 325 MG PO TABS
650.0000 mg | ORAL_TABLET | ORAL | Status: DC | PRN
  Administered 2012-10-22 (×2): 650 mg via ORAL
  Filled 2012-10-22 (×2): qty 2

## 2012-10-22 MED ORDER — ONDANSETRON HCL 4 MG PO TABS: 4.0000 mg | ORAL_TABLET | ORAL | Status: DC | PRN

## 2012-10-22 MED ORDER — LACTATED RINGERS IV SOLN
INTRAVENOUS | Status: DC
  Administered 2012-10-22: 16:00:00 via INTRAVENOUS
  Administered 2012-10-22: 950 mL via INTRAVENOUS
  Administered 2012-10-22: 13:00:00 via INTRAVENOUS

## 2012-10-22 MED ORDER — LIDOCAINE HCL (PF) 1 % IJ SOLN
INTRAMUSCULAR | Status: DC | PRN
  Administered 2012-10-22 (×2): 4 mL

## 2012-10-22 MED ORDER — TERBUTALINE SULFATE 1 MG/ML IJ SOLN: 0.2500 mg | Freq: Once | INTRAMUSCULAR | Status: DC | PRN

## 2012-10-22 MED ORDER — SENNOSIDES-DOCUSATE SODIUM 8.6-50 MG PO TABS
2.0000 | ORAL_TABLET | Freq: Every day | ORAL | Status: DC
  Administered 2012-10-22 – 2012-10-23 (×2): 2 via ORAL

## 2012-10-22 MED ORDER — FLUTICASONE PROPIONATE 50 MCG/ACT NA SUSP
2.0000 | Freq: Every day | NASAL | Status: DC
  Filled 2012-10-22: qty 16

## 2012-10-22 MED ORDER — LACTATED RINGERS IV SOLN
500.0000 mL | Freq: Once | INTRAVENOUS | Status: AC
  Administered 2012-10-22: 500 mL via INTRAVENOUS

## 2012-10-22 MED ORDER — OXYTOCIN BOLUS FROM INFUSION
500.0000 mL | INTRAVENOUS | Status: DC
  Administered 2012-10-22: 500 mL via INTRAVENOUS

## 2012-10-22 MED ORDER — EPHEDRINE 5 MG/ML INJ: 10.0000 mg | INTRAVENOUS | Status: DC | PRN

## 2012-10-22 NOTE — Anesthesia Preprocedure Evaluation (Signed)
Anesthesia Evaluation  Patient identified by MRN, date of birth, ID band Patient awake    Reviewed: Allergy & Precautions, H&P , Patient's Chart, lab work & pertinent test results  Airway Mallampati: III TM Distance: >3 FB Neck ROM: full    Dental no notable dental hx. (+) Teeth Intact   Pulmonary neg pulmonary ROS, asthma ,  breath sounds clear to auscultation  Pulmonary exam normal       Cardiovascular hypertension, negative cardio ROS  Rhythm:regular Rate:Normal  PIH   Neuro/Psych Depression negative neurological ROS  negative psych ROS   GI/Hepatic negative GI ROS, Neg liver ROS,   Endo/Other  negative endocrine ROS  Renal/GU negative Renal ROS  negative genitourinary   Musculoskeletal   Abdominal Normal abdominal exam  (+)   Peds  Hematology negative hematology ROS (+)   Anesthesia Other Findings   Reproductive/Obstetrics (+) Pregnancy                           Anesthesia Physical Anesthesia Plan  ASA: II  Anesthesia Plan: Epidural   Post-op Pain Management:    Induction:   Airway Management Planned:   Additional Equipment:   Intra-op Plan:   Post-operative Plan:   Informed Consent: I have reviewed the patients History and Physical, chart, labs and discussed the procedure including the risks, benefits and alternatives for the proposed anesthesia with the patient or authorized representative who has indicated his/her understanding and acceptance.     Plan Discussed with: Anesthesiologist  Anesthesia Plan Comments:         Anesthesia Quick Evaluation

## 2012-10-22 NOTE — Progress Notes (Signed)
Dr Malen Gauze notified that pt was having increased blood pressures and a platelet count of 125 before epidural placement.  Dr. Malen Gauze agreed that a repeat platelet count should be resulted before pulling epidural catheter.  Order received.

## 2012-10-22 NOTE — Progress Notes (Signed)
   Subjective: Pt uncomfortable and wanting epidural  Objective: BP 149/88  Pulse 87  Temp(Src) 97.7 F (36.5 C) (Oral)  Resp 24  Ht 5\' 4"  (1.626 m)  Wt 92.08 kg (203 lb)  BMI 34.83 kg/m2  LMP 01/24/2012      FHT:  FHR: 145 bpm, variability: moderate,  accelerations:  Present,  decelerations:  Absent UC:   regular, every 3-5 minutes SVE:   Dilation: 3.5 Effacement (%): 60 Station: -2 Exam by:: [employer]: Lab Results  Component Value Date   WBC 10.7* 10/22/2012   HGB 11.3* 10/22/2012   HCT 33.7* 10/22/2012   MCV 84.5 10/22/2012   PLT 125* 10/22/2012    Assessment / Plan: To get epidural. Oliver Pila 10/22/2012, 1:49 PM

## 2012-10-22 NOTE — Progress Notes (Signed)
Patient ID: Kimberly Fernandez, female   DOB: August 25, 1975, 37 y.o.   MRN: [id number] Pt comfortable, no major ctx yet.  Some HA this AM FHR reactive Irregular contractions  Cervix 50/2.5/-2  AROM clear  Labs WNL Will check urine for protein BP acceptable at rest 130-140/70-80's  Pt on pitocin, will continue.

## 2012-10-22 NOTE — Progress Notes (Signed)
Admission nutrition screen triggered. Patients chart reviewed and assessed  for nutritional risk.Pts overall weight gain of 19 lbs adequate for BMI.   Patient is determined to be at low nutrition  risk.   Elisabeth Cara M.Odis Luster LDN Neonatal Nutrition Support Specialist Pager [phone]

## 2012-10-22 NOTE — Anesthesia Procedure Notes (Signed)
Epidural Patient location during procedure: OB Start time: 10/22/2012 2:16 PM  Staffing Anesthesiologist: Lashaun Poch A. Performed by: anesthesiologist   Preanesthetic Checklist Completed: patient identified, site marked, surgical consent, pre-op evaluation, timeout performed, IV checked, risks and benefits discussed and monitors and equipment checked  Epidural Patient position: sitting Prep: site prepped and draped and DuraPrep Patient monitoring: continuous pulse ox and blood pressure Approach: midline Injection technique: LOR air  Needle:  Needle type: Tuohy  Needle gauge: 17 G Needle length: 9 cm and 9 Needle insertion depth: 6 cm Catheter type: closed end flexible Catheter size: 19 Gauge Catheter at skin depth: 11 cm Test dose: negative and Other  Assessment Events: blood not aspirated, injection not painful, no injection resistance, negative IV test and no paresthesia  Additional Notes Patient identified. Risks and benefits discussed including failed block, incomplete  Pain control, post dural puncture headache, nerve damage, paralysis, blood pressure Changes, nausea, vomiting, reactions to medications-both toxic and allergic and post Partum back pain. All questions were answered. Patient expressed understanding and wished to proceed. Sterile technique was used throughout procedure. Epidural site was Dressed with sterile barrier dressing. No paresthesias, signs of intravascular injection Or signs of intrathecal spread were encountered.  Patient was more comfortable after the epidural was dosed. Please see RN's note for documentation of vital signs and FHR which are stable.

## 2012-10-23 DIAGNOSIS — O139 Gestational [pregnancy-induced] hypertension without significant proteinuria, unspecified trimester: Secondary | ICD-10-CM

## 2012-10-23 LAB — CBC
HCT: 32.3 % — ABNORMAL LOW (ref 36.0–46.0)
Hemoglobin: 10.8 g/dL — ABNORMAL LOW (ref 12.0–15.0)
RBC: 3.78 MIL/uL — ABNORMAL LOW (ref 3.87–5.11)
RDW: 15.5 % (ref 11.5–15.5)
WBC: 16.3 10*3/uL — ABNORMAL HIGH (ref 4.0–10.5)

## 2012-10-23 MED ORDER — LABETALOL HCL 100 MG PO TABS
100.0000 mg | ORAL_TABLET | Freq: Two times a day (BID) | ORAL | Status: DC
  Administered 2012-10-23 – 2012-10-24 (×2): 100 mg via ORAL
  Filled 2012-10-23 (×2): qty 1

## 2012-10-23 NOTE — Progress Notes (Signed)
Dr. Ellyn Hack contacted due to pt's BP 149/90. BP's have been running high. No PIH symptoms. Order to start labetalol 100mg  bid.

## 2012-10-23 NOTE — Discharge Summary (Signed)
Obstetric Discharge Summary Reason for Admission: [facility] Prenatal Procedures: NST Intrapartum Procedures: spontaneous vaginal delivery Postpartum Procedures: none Complications-Operative and Postpartum: second degree perineal laceration Hemoglobin  Date Value Range Status  10/23/2012 10.8* 12.0 - 15.0 g/dL Final  46/96/2952 84.1* 12.2 - 16.2 g/dL Final     HCT  Date Value Range Status  10/23/2012 32.3* 36.0 - 46.0 % Final     HCT, POC  Date Value Range Status  07/10/2012 37.6* 37.7 - 47.9 % Final    Physical Exam:  General: alert and cooperative Lochia: appropriate Uterine Fundus: firm   Discharge Diagnoses: Term Pregnancy-delivered  Discharge Information: Date: 10/24/2012 Activity: pelvic rest Diet: routine Medications: Ibuprofen and Percocet Condition: improved Instructions: refer to practice specific booklet Discharge to: home Follow-up Information   Follow up with Oliver Pila, MD. Schedule an appointment as soon as possible for a visit in 6 weeks.   Contact information:   [address], [address] [place] [address] [phone]       Newborn Data: Live born female  Birth Weight: 7 lb 13 oz (3545 g) APGAR: 5, 8  Home with mother.  Oliver Pila 10/24/2012, 8:12 AM

## 2012-10-23 NOTE — Anesthesia Postprocedure Evaluation (Signed)
  Anesthesia Post-op Note  Patient: Kimberly Fernandez  Procedure(s) Performed: * No procedures listed *  Patient Location: Mother/Baby  Anesthesia Type:Epidural  Level of Consciousness: awake, alert , oriented and patient cooperative  Airway and Oxygen Therapy: Patient Spontanous Breathing  Post-op Pain: mild  Post-op Assessment: Patient's Cardiovascular Status Stable, Respiratory Function Stable, Patent Airway, No signs of Nausea or vomiting, Adequate PO intake and Pain level controlled  Post-op Vital Signs: Reviewed and stable  Complications: No apparent anesthesia complications

## 2012-10-23 NOTE — Progress Notes (Signed)
Post Partum Day 1 Subjective: no complaints, up ad lib and tolerating PO  Objective: Blood pressure 136/87, pulse 97, temperature 98.3 F (36.8 C), temperature source Oral, resp. rate 18, height 5\' 4"  (1.626 m), weight 92.08 kg (203 lb), last menstrual period 01/24/2012, SpO2 94.00%, unknown if currently breastfeeding.  Physical Exam:  General: alert and cooperative Lochia: appropriate Uterine Fundus: firm    Recent Labs  10/22/12 1857 10/23/12 0625  HGB 10.9* 10.8*  HCT 32.3* 32.3*    Assessment/Plan: Plan for discharge tomorrow BP still elevated at 140/90's at times. No symptoms. Will follow BP   LOS: 1 day   RICHARDSON,KATHY W 10/23/2012, 9:00 AM

## 2012-10-24 MED ORDER — IBUPROFEN 600 MG PO TABS: 600.0000 mg | ORAL_TABLET | Freq: Four times a day (QID) | ORAL | Status: DC

## 2012-10-24 MED ORDER — OXYCODONE-ACETAMINOPHEN 5-325 MG PO TABS: 1.0000 | ORAL_TABLET | ORAL | Status: DC | PRN

## 2012-10-24 MED ORDER — FUROSEMIDE 20 MG PO TABS: 20.0000 mg | ORAL_TABLET | Freq: Two times a day (BID) | ORAL | Status: DC

## 2012-10-24 MED ORDER — LABETALOL HCL 100 MG PO TABS: 100.0000 mg | ORAL_TABLET | Freq: Two times a day (BID) | ORAL | Status: DC

## 2012-10-24 NOTE — Progress Notes (Signed)
Post Partum Day 2 Subjective: no complaints, up ad lib and tolerating PO  BP stable on labetalol 100mg  po BID  Objective: Blood pressure 135/83, pulse 63, temperature 97.4 F (36.3 C), temperature source Oral, resp. rate 18, height 5\' 4"  (1.626 m), weight 92.08 kg (203 lb), last menstrual period 01/24/2012, SpO2 99.00%, unknown if currently breastfeeding.  Physical Exam:  General: alert and cooperative Lochia: appropriate Uterine Fundus: firm    Recent Labs  10/22/12 1857 10/23/12 0625  HGB 10.9* 10.8*  HCT 32.3* 32.3*    Assessment/Plan: Discharge home Motrin and percocet Labetalol for BP and will send home with some Lasix as has had fluid retention issues that led to readmission with her last 2 pregnancies.   LOS: 2 days   RICHARDSON,KATHY W 10/24/2012, 8:08 AM

## 2012-10-24 NOTE — Progress Notes (Signed)
Pt discharged before CSW could assess pt's history of depression/anxiety.

## 2012-12-19 ENCOUNTER — Encounter (HOSPITAL_COMMUNITY): Payer: Self-pay | Admitting: *Deleted

## 2012-12-19 ENCOUNTER — Emergency Department (HOSPITAL_COMMUNITY)
Admission: EM | Admit: 2012-12-19 | Discharge: 2012-12-19 | Disposition: A | Discharge status: Home / Self Care | Payer: BC Managed Care – PPO | Source: Home / Self Care | Attending: Emergency Medicine | Admitting: Emergency Medicine

## 2012-12-19 DIAGNOSIS — J020 Streptococcal pharyngitis: Secondary | ICD-10-CM

## 2012-12-19 LAB — POCT RAPID STREP A: Streptococcus, Group A Screen (Direct): POSITIVE — AB

## 2012-12-19 MED ORDER — AMOXICILLIN 500 MG PO CAPS: 500.0000 mg | ORAL_CAPSULE | Freq: Three times a day (TID) | ORAL | Status: AC

## 2012-12-19 NOTE — ED Provider Notes (Signed)
History     CSN: [id number]  Arrival date & time 12/19/12  1209   First MD Initiated Contact with Patient 12/19/12 1346      Chief Complaint  Patient presents with  . Sore Throat    (Consider location/radiation/quality/duration/timing/severity/associated sxs/prior treatment) HPI Comments: Patient presents describing a sore throat since yesterday. Other family members as well have had confirm strength cortical pharyngitis. As pain with swallowing tactile fevers and headaches. Denies any abdominal pain nausea or vomiting.  Patient is a 37 y.o. female presenting with pharyngitis. The history is provided by the patient.  Sore Throat This is a new problem. The current episode started 12 to 24 hours ago. The problem occurs constantly. The problem has been gradually worsening. Pertinent negatives include no abdominal pain and no shortness of breath. The symptoms are aggravated by swallowing. Nothing relieves the symptoms. The treatment provided no relief.    Past Medical History  Diagnosis Date  . Reflux   . Asthma   . Hypertension   . Albinism   . Nystagmus   . Depression   . Pregnancy induced hypertension     Past Surgical History  Procedure Laterality Date  . Tooth extraction    . Upper gastrointestinal endoscopy      Family History  Problem Relation Age of Onset  . Hypertension Father   . Cancer Father     lung  . Heart disease Maternal Grandmother   . Cancer Maternal Grandfather     lung    History  Substance Use Topics  . Smoking status: Never Smoker   . Smokeless tobacco: Never Used  . Alcohol Use: Yes     Comment: occasional    OB History   Grav Para Term Preterm Abortions TAB SAB Ect Mult Living   3 3 3  0 0 0 0 0 0 3      Review of Systems  Constitutional: Positive for fever and appetite change. Negative for activity change.  HENT: Positive for sore throat. Negative for congestion, rhinorrhea, sneezing, trouble swallowing, neck pain, neck stiffness,  voice change and postnasal drip.   Respiratory: Negative for cough, shortness of breath, wheezing and stridor.   Gastrointestinal: Negative for vomiting and abdominal pain.  Skin: Negative for rash.    Allergies  Doxycycline and Aspirin  Home Medications   Current Outpatient Rx  Name  Route  Sig  Dispense  Refill  . amoxicillin (AMOXIL) 500 MG capsule   Oral   Take 1 capsule (500 mg total) by mouth 3 (three) times daily.   30 capsule   0   . Cetirizine HCl 10 MG CAPS   Oral   Take 1 capsule (10 mg total) by mouth daily.   30 capsule   5   . fluticasone (FLONASE) 50 MCG/ACT nasal spray   Nasal   Place 2 sprays into the nose daily.   16 g   2   . furosemide (LASIX) 20 MG tablet   Oral   Take 1 tablet (20 mg total) by mouth 2 (two) times daily.   30 tablet   1   . ibuprofen (ADVIL,MOTRIN) 600 MG tablet   Oral   Take 1 tablet (600 mg total) by mouth every 6 (six) hours.   30 tablet   1   . labetalol (NORMODYNE) 100 MG tablet   Oral   Take 1 tablet (100 mg total) by mouth 2 (two) times daily.   60 tablet   0   .  oxyCODONE-acetaminophen (PERCOCET/ROXICET) 5-325 MG per tablet   Oral   Take 1-2 tablets by mouth every 4 (four) hours as needed.   30 tablet   0   . pantoprazole (PROTONIX) 20 MG tablet   Oral   Take 20 mg by mouth daily.         . potassium chloride (K-DUR) 10 MEQ tablet   Oral   Take 2 tablets (20 mEq total) by mouth 2 (two) times daily.   120 tablet   2   . Prenatal Vit-Fe Fumarate-FA (MULTIVITAMIN-PRENATAL) 27-0.8 MG TABS   Oral   Take 1 tablet by mouth daily.           BP 137/57  Pulse 63  Temp(Src) 97.9 F (36.6 C) (Oral)  Resp 16  SpO2 100%  Physical Exam  Nursing note and vitals reviewed. Constitutional: She appears well-developed and well-nourished.  HENT:  Right Ear: Hearing and tympanic membrane normal.  Left Ear: Hearing and tympanic membrane normal.  Mouth/Throat: Uvula is midline. Posterior oropharyngeal  erythema present. No oropharyngeal exudate, posterior oropharyngeal edema or tonsillar abscesses.  Eyes: Conjunctivae are normal.  Neck: No JVD present.  Pulmonary/Chest: Effort normal and breath sounds normal.  Abdominal: Soft.  Lymphadenopathy:    She has cervical adenopathy.       Right cervical: Superficial cervical adenopathy present.       Left cervical: Superficial cervical adenopathy present.  Neurological: She is alert.  Skin: No erythema.    ED Course  Procedures (including critical care time)  Labs Reviewed  POCT RAPID STREP A ([facility] ONLY) - Abnormal; Notable for the following:    Streptococcus, Group A Screen (Direct) POSITIVE (*)    All other components within normal limits   No results found.   1. Strep pharyngitis       MDM  On complicated streptococcal pharyngitis. Patient provided with a prescription of amoxicillin for 10 days.        Jimmie Molly, MD 12/19/12 1414

## 2012-12-19 NOTE — ED Notes (Signed)
Pt  Reports  Symptoms  Of  sorethroat      Low  Grade  Temp   Headache       And pain  When  She  Swallows   Symptoms   Since  Yesterday

## 2013-01-12 ENCOUNTER — Emergency Department (HOSPITAL_COMMUNITY)
Admission: EM | Admit: 2013-01-12 | Discharge: 2013-01-12 | Disposition: A | Discharge status: Home / Self Care | Payer: BC Managed Care – PPO | Source: Home / Self Care

## 2013-01-12 ENCOUNTER — Encounter (HOSPITAL_COMMUNITY): Payer: Self-pay | Admitting: Emergency Medicine

## 2013-01-12 DIAGNOSIS — J309 Allergic rhinitis, unspecified: Secondary | ICD-10-CM

## 2013-01-12 DIAGNOSIS — J029 Acute pharyngitis, unspecified: Secondary | ICD-10-CM

## 2013-01-12 DIAGNOSIS — J020 Streptococcal pharyngitis: Secondary | ICD-10-CM

## 2013-01-12 NOTE — ED Notes (Signed)
Pt reports headache, sore throat, and left ear pain x several days.   "husband recently dx with strep" denies fever, n/v/d  Pt has been using ibuprofen with no relief in symptoms.

## 2013-01-12 NOTE — ED Provider Notes (Signed)
History     CSN: [id number]  Arrival date & time 01/12/13  1634   First MD Initiated Contact with Patient 01/12/13 1719      Chief Complaint  Patient presents with  . Sore Throat    sosre throat and headache with left ear pain. symptoms x several days  . Otalgia    (Consider location/radiation/quality/duration/timing/severity/associated sxs/prior treatment) HPI Comments: 37 year old female with history of blindness in, depression and asthma presents with a complaint of headache, sore throat and earache for 2 days. She has been taking ibuprofen with modest results. She denies fever or shortness of breath. She has a history of allergic rhinitis but has not been taking her Zyrtec a regular basis. She states that she was exposed to her husband recently who had strep throat. The patient was also here 2 weeks ago with a sore throat and diagnosed with a positive strep throat and treated with amoxicillin.   Past Medical History  Diagnosis Date  . Reflux   . Asthma   . Hypertension   . Albinism   . Nystagmus   . Depression   . Pregnancy induced hypertension     Past Surgical History  Procedure Laterality Date  . Tooth extraction    . Upper gastrointestinal endoscopy      Family History  Problem Relation Age of Onset  . Hypertension Father   . Cancer Father     lung  . Heart disease Maternal Grandmother   . Cancer Maternal Grandfather     lung    History  Substance Use Topics  . Smoking status: Never Smoker   . Smokeless tobacco: Never Used  . Alcohol Use: Yes     Comment: occasional    OB History   Grav Para Term Preterm Abortions TAB SAB Ect Mult Living   3 3 3  0 0 0 0 0 0 3      Review of Systems  Constitutional: Negative.   HENT:       As per history of present illness  Eyes: Negative.   Respiratory: Positive for cough.   Genitourinary: Negative.   Neurological: Negative.   Hematological: Negative.     Allergies  Doxycycline and Aspirin  Home  Medications   Current Outpatient Rx  Name  Route  Sig  Dispense  Refill  . Cetirizine HCl 10 MG CAPS   Oral   Take 1 capsule (10 mg total) by mouth daily.   30 capsule   5   . pantoprazole (PROTONIX) 20 MG tablet   Oral   Take 20 mg by mouth daily.         . Prenatal Vit-Fe Fumarate-FA (MULTIVITAMIN-PRENATAL) 27-0.8 MG TABS   Oral   Take 1 tablet by mouth daily.         . fluticasone (FLONASE) 50 MCG/ACT nasal spray   Nasal   Place 2 sprays into the nose daily.   16 g   2   . furosemide (LASIX) 20 MG tablet   Oral   Take 1 tablet (20 mg total) by mouth 2 (two) times daily.   30 tablet   1   . ibuprofen (ADVIL,MOTRIN) 600 MG tablet   Oral   Take 1 tablet (600 mg total) by mouth every 6 (six) hours.   30 tablet   1   . labetalol (NORMODYNE) 100 MG tablet   Oral   Take 1 tablet (100 mg total) by mouth 2 (two) times daily.   60 tablet  0   . oxyCODONE-acetaminophen (PERCOCET/ROXICET) 5-325 MG per tablet   Oral   Take 1-2 tablets by mouth every 4 (four) hours as needed.   30 tablet   0   . potassium chloride (K-DUR) 10 MEQ tablet   Oral   Take 2 tablets (20 mEq total) by mouth 2 (two) times daily.   120 tablet   2     BP 135/78  Pulse 55  Temp(Src) 98.3 F (36.8 C) (Oral)  SpO2 100%  Breastfeeding? No  Physical Exam  Nursing note and vitals reviewed. Constitutional: She is oriented to person, place, and time. She appears well-developed and well-nourished. No distress.  HENT:  Right Ear: External ear normal.  Left Ear: External ear normal.  Mouth/Throat: No oropharyngeal exudate.  Oropharynx with minor erythema, cobblestoning and clear PND. No exudates  Eyes: Conjunctivae and EOM are normal.  Neck: Normal range of motion. Neck supple.  Cardiovascular: Normal rate and regular rhythm.   Pulmonary/Chest: Effort normal and breath sounds normal. No respiratory distress.  Musculoskeletal: Normal range of motion. She exhibits no edema.   Lymphadenopathy:    She has no cervical adenopathy.  Neurological: She is alert and oriented to person, place, and time.  Skin: Skin is warm and dry. No rash noted.  Psychiatric: She has a normal mood and affect.    ED Course  Procedures (including critical care time)  Labs Reviewed  POCT RAPID STREP A ([facility] ONLY)   No results found.   1. Allergic rhinitis due to allergen   2. Allergic pharyngitis       MDM  Restart taking her Zyrtec every day when he got home. Does work for the past. Drink plenty of fluids, stay well-hydrated Followup with your doctor as needed next week to call for appointment.        Hayden Rasmussen, NP 01/12/13 1747

## 2013-01-13 NOTE — ED Provider Notes (Signed)
Medical screening examination/treatment/procedure(s) were performed by resident physician or non-physician practitioner and as supervising physician I was immediately available for consultation/collaboration.   Barkley Bruns MD.   Linna Hoff, MD 01/13/13 [phone]

## 2013-03-04 ENCOUNTER — Telehealth: Payer: Self-pay

## 2013-03-04 ENCOUNTER — Encounter (HOSPITAL_COMMUNITY): Payer: Self-pay

## 2013-03-04 ENCOUNTER — Ambulatory Visit (INDEPENDENT_AMBULATORY_CARE_PROVIDER_SITE_OTHER): Payer: BC Managed Care – PPO | Admitting: Family Medicine

## 2013-03-04 ENCOUNTER — Emergency Department (HOSPITAL_COMMUNITY)
Admission: EM | Admit: 2013-03-04 | Discharge: 2013-03-04 | Disposition: A | Discharge status: Home / Self Care | Payer: BC Managed Care – PPO | Source: Home / Self Care

## 2013-03-04 VITALS — BP 124/68 | HR 51 | Temp 97.4°F | Resp 18 | Ht 65.0 in | Wt 159.0 lb

## 2013-03-04 DIAGNOSIS — M542 Cervicalgia: Secondary | ICD-10-CM

## 2013-03-04 DIAGNOSIS — M436 Torticollis: Secondary | ICD-10-CM

## 2013-03-04 MED ORDER — HYDROCODONE-ACETAMINOPHEN 5-325 MG PO TABS: 1.0000 | ORAL_TABLET | Freq: Three times a day (TID) | ORAL | Status: DC | PRN

## 2013-03-04 MED ORDER — CYCLOBENZAPRINE HCL 5 MG PO TABS: 5.0000 mg | ORAL_TABLET | Freq: Every evening | ORAL | Status: DC | PRN

## 2013-03-04 MED ORDER — IBUPROFEN 600 MG PO TABS: 600.0000 mg | ORAL_TABLET | Freq: Four times a day (QID) | ORAL | Status: DC

## 2013-03-04 MED ORDER — PREDNISONE 20 MG PO TABS: 20.0000 mg | ORAL_TABLET | Freq: Every day | ORAL | Status: DC

## 2013-03-04 NOTE — Progress Notes (Signed)
This is a 37 year old married woman with 3 children age 49-1/2 months, 2 years, and 5 years. She comes in with neck pain primarily on the right side which has been going on for a month. She's had a similar problem in the past. She's currently breast-feeding. She attributes the pain to lifting her children and working in the [employer] lab. 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     patient's past medical history is significant for albinism, nystagmus(congenital) with legal blindness.  Objective: No acute distress Patient is obviously an albino with congenital nystagmus Neck range of motion: 20 the left rotation, 30-45 to the right. Minimal flexion is possible without pain. Patient is mildly tender in the trapezius area at the base of the neck around C7 on the right. Reflexes in the upper [place] are normal (biceps and triceps jerks) Motor testing is normal grasps, wrists, elbows, shoulders.  Assessment: Recurrent torticollis  Plan: Prednisone 40 mg daily x3, Flexeril each bedtime, gentle range of motion. I offered physical therapy the patient will be going out of town for the beach and couple days and wants to postpone this.  Signed, Elvina Sidle

## 2013-03-04 NOTE — ED Provider Notes (Signed)
Kimberly Fernandez is a 37 y.o. female who presents to Urgent Care today for neck pain present for 1 month. Patient notes muscle tension and pain in her posterior neck and shoulders. She denies any radiation or weakness. She is well otherwise. No fevers or chills. This is c/w prior episodes of neck pain flairs. She has tried OTC ibuprofen, heat and stretching. She has never had PT.  No injury.    PMH reviewed. 4.5 months s/p delivery of child.   History  Substance Use Topics  . Smoking status: Never Smoker   . Smokeless tobacco: Never Used  . Alcohol Use: Yes     Comment: occasional   ROS as above Medications reviewed. No current facility-administered medications for this encounter.   Current Outpatient Prescriptions  Medication Sig Dispense Refill  . Cetirizine HCl 10 MG CAPS Take 1 capsule (10 mg total) by mouth daily.  30 capsule  5  . fluticasone (FLONASE) 50 MCG/ACT nasal spray Place 2 sprays into the nose daily.  16 g  2  . furosemide (LASIX) 20 MG tablet Take 1 tablet (20 mg total) by mouth 2 (two) times daily.  30 tablet  1  . HYDROcodone-acetaminophen (NORCO) 5-325 MG per tablet Take 1 tablet by mouth every 8 (eight) hours as needed for pain.  15 tablet  0  . ibuprofen (ADVIL,MOTRIN) 600 MG tablet Take 1 tablet (600 mg total) by mouth every 6 (six) hours.  30 tablet  1  . labetalol (NORMODYNE) 100 MG tablet Take 1 tablet (100 mg total) by mouth 2 (two) times daily.  60 tablet  0  . oxyCODONE-acetaminophen (PERCOCET/ROXICET) 5-325 MG per tablet Take 1-2 tablets by mouth every 4 (four) hours as needed.  30 tablet  0  . pantoprazole (PROTONIX) 20 MG tablet Take 20 mg by mouth daily.      . potassium chloride (K-DUR) 10 MEQ tablet Take 2 tablets (20 mEq total) by mouth 2 (two) times daily.  120 tablet  2  . Prenatal Vit-Fe Fumarate-FA (MULTIVITAMIN-PRENATAL) 27-0.8 MG TABS Take 1 tablet by mouth daily.      . [DISCONTINUED] diphenhydramine-acetaminophen (TYLENOL PM) 25-500 MG TABS Take 1  tablet by mouth at bedtime as needed. Patient used medication for sleep.         Exam:  BP 145/81  Pulse 53  Temp(Src) 97.7 F (36.5 C) (Oral)  Resp 18  SpO2 100% Gen: Well NAD MSK: C-spine: Nontender spinal midline. TTP BL cervical paraspinals and traps.  Normal neck ROM.  Neg Spurling test BL.  Grip strength intact BL Sensation intact distally.    No results found for this or any previous visit (from the past 24 hour(s)). No results found.  Assessment and Plan: 37 y.o. female with cervical myofascial pain.  Plan: NSAIDs, small amount of norco  For pain.  Heating pad. Refer PT.  F/u prn.      Rodolph Bong, MD 03/04/13 [phone]

## 2013-03-04 NOTE — Telephone Encounter (Signed)
Pharmacy has questions about her rx that was written today  Best number [phone]

## 2013-03-04 NOTE — ED Notes (Signed)
C/o HA for past couple of days; has 3 small children

## 2013-03-06 NOTE — Telephone Encounter (Signed)
Called pharmacy back and pharmacist does not see anything in concern in her computer.

## 2013-03-25 ENCOUNTER — Other Ambulatory Visit (HOSPITAL_COMMUNITY): Payer: Self-pay | Admitting: Obstetrics and Gynecology

## 2013-03-25 DIAGNOSIS — N971 Female infertility of tubal origin: Secondary | ICD-10-CM

## 2013-04-02 ENCOUNTER — Ambulatory Visit (HOSPITAL_COMMUNITY)
Admission: RE | Admit: 2013-04-02 | Discharge: 2013-04-02 | Disposition: A | Discharge status: Home / Self Care | Payer: BC Managed Care – PPO | Source: Ambulatory Visit | Attending: Obstetrics and Gynecology | Admitting: Obstetrics and Gynecology

## 2013-04-02 DIAGNOSIS — Z3049 Encounter for surveillance of other contraceptives: Secondary | ICD-10-CM | POA: Insufficient documentation

## 2013-04-02 DIAGNOSIS — N971 Female infertility of tubal origin: Secondary | ICD-10-CM

## 2013-04-02 MED ORDER — IOHEXOL 300 MG/ML  SOLN
20.0000 mL | Freq: Once | INTRAMUSCULAR | Status: AC | PRN
  Administered 2013-04-02: 20 mL

## 2013-06-20 ENCOUNTER — Other Ambulatory Visit: Payer: Self-pay

## 2013-10-20 ENCOUNTER — Encounter (HOSPITAL_COMMUNITY): Payer: Self-pay | Admitting: Emergency Medicine

## 2013-10-20 ENCOUNTER — Emergency Department (INDEPENDENT_AMBULATORY_CARE_PROVIDER_SITE_OTHER)
Admission: EM | Admit: 2013-10-20 | Discharge: 2013-10-20 | Disposition: A | Discharge status: Home / Self Care | Payer: BC Managed Care – PPO | Source: Home / Self Care | Attending: Emergency Medicine | Admitting: Emergency Medicine

## 2013-10-20 DIAGNOSIS — G43909 Migraine, unspecified, not intractable, without status migrainosus: Secondary | ICD-10-CM

## 2013-10-20 MED ORDER — KETOROLAC TROMETHAMINE 60 MG/2ML IM SOLN
60.0000 mg | Freq: Once | INTRAMUSCULAR | Status: AC
  Administered 2013-10-20: 60 mg via INTRAMUSCULAR

## 2013-10-20 MED ORDER — DIPHENHYDRAMINE HCL 50 MG/ML IJ SOLN
50.0000 mg | Freq: Once | INTRAMUSCULAR | Status: AC
  Administered 2013-10-20: 50 mg via INTRAMUSCULAR

## 2013-10-20 MED ORDER — METOCLOPRAMIDE HCL 5 MG/ML IJ SOLN
10.0000 mg | Freq: Once | INTRAMUSCULAR | Status: AC
  Administered 2013-10-20: 10 mg via INTRAMUSCULAR

## 2013-10-20 MED ORDER — KETOROLAC TROMETHAMINE 60 MG/2ML IM SOLN
INTRAMUSCULAR | Status: AC
  Filled 2013-10-20: qty 2

## 2013-10-20 MED ORDER — METOCLOPRAMIDE HCL 5 MG/ML IJ SOLN
INTRAMUSCULAR | Status: AC
  Filled 2013-10-20: qty 2

## 2013-10-20 MED ORDER — DIPHENHYDRAMINE HCL 50 MG/ML IJ SOLN
INTRAMUSCULAR | Status: AC
  Filled 2013-10-20: qty 1

## 2013-10-20 NOTE — ED Notes (Signed)
Patient sipping coke and ice chips

## 2013-10-20 NOTE — Discharge Instructions (Signed)
Migraine Headache A migraine headache is an intense, throbbing pain on one or both sides of your head. A migraine can last for 30 minutes to several hours. CAUSES  The exact cause of a migraine headache is not always known. However, a migraine may be caused when nerves in the brain become irritated and release chemicals that cause inflammation. This causes pain. Certain things may also trigger migraines, such as:  Alcohol.  Smoking.  Stress.  Menstruation.  Aged cheeses.  Foods or drinks that contain nitrates, glutamate, aspartame, or tyramine.  Lack of sleep.  Chocolate.  Caffeine.  Hunger.  Physical exertion.  Fatigue.  Medicines used to treat chest pain (nitroglycerine), birth control pills, estrogen, and some blood pressure medicines. SIGNS AND SYMPTOMS  Pain on one or both sides of your head.  Pulsating or throbbing pain.  Severe pain that prevents daily activities.  Pain that is aggravated by any physical activity.  Nausea, vomiting, or both.  Dizziness.  Pain with exposure to bright lights, loud noises, or activity.  General sensitivity to bright lights, loud noises, or smells. Before you get a migraine, you may get warning signs that a migraine is coming (aura). An aura may include:  Seeing flashing lights.  Seeing bright spots, halos, or zig-zag lines.  Having tunnel vision or blurred vision.  Having feelings of numbness or tingling.  Having trouble talking.  Having muscle weakness. DIAGNOSIS  A migraine headache is often diagnosed based on:  Symptoms.  Physical exam.  A CT scan or MRI of your head. These imaging tests cannot diagnose migraines, but they can help rule out other causes of headaches. TREATMENT Medicines may be given for pain and nausea. Medicines can also be given to help prevent recurrent migraines.  HOME CARE INSTRUCTIONS  Only take over-the-counter or prescription medicines for pain or discomfort as directed by your  health care provider. The use of long-term narcotics is not recommended.  Lie down in a dark, quiet room when you have a migraine.  Keep a journal to find out what may trigger your migraine headaches. For example, write down:  What you eat and drink.  How much sleep you get.  Any change to your diet or medicines.  Limit alcohol consumption.  Quit smoking if you smoke.  Get 7 9 hours of sleep, or as recommended by your health care provider.  Limit stress.  Keep lights dim if bright lights bother you and make your migraines worse. SEEK IMMEDIATE MEDICAL CARE IF:   Your migraine becomes severe.  You have a fever.  You have a stiff neck.  You have vision loss.  You have muscular weakness or loss of muscle control.  You start losing your balance or have trouble walking.  You feel faint or pass out.  You have severe symptoms that are different from your first symptoms. MAKE SURE YOU:   Understand these instructions.  Will watch your condition.  Will get help right away if you are not doing well or get worse. Document Released: 08/01/2005 Document Revised: 05/22/2013 Document Reviewed: 04/08/2013 [employer] Patient Information 2014 [employer].  

## 2013-10-20 NOTE — ED Provider Notes (Signed)
CSN: [id number]     Arrival date & time 10/20/13  0903 History   First MD Initiated Contact with Patient 10/20/13 [id number]     Chief Complaint  Patient presents with  . Migraine   (Consider location/radiation/quality/duration/timing/severity/associated sxs/prior Treatment) Patient is a 38 y.o. female presenting with migraines. The history is provided by the patient. No language interpreter was used.  Migraine This is a new problem. The current episode started 2 days ago. The problem occurs constantly. The problem has been gradually worsening. Nothing aggravates the symptoms. Nothing relieves the symptoms. She has tried nothing for the symptoms.    Past Medical History  Diagnosis Date  . Reflux   . Asthma   . Hypertension   . Albinism   . Nystagmus   . Depression   . Pregnancy induced hypertension    Past Surgical History  Procedure Laterality Date  . Tooth extraction    . Upper gastrointestinal endoscopy     Family History  Problem Relation Age of Onset  . Hypertension Father   . Cancer Father     lung  . Heart disease Maternal Grandmother   . Cancer Maternal Grandfather     lung   History  Substance Use Topics  . Smoking status: Never Smoker   . Smokeless tobacco: Never Used  . Alcohol Use: Yes     Comment: occasional   OB History   Grav Para Term Preterm Abortions TAB SAB Ect Mult Living   3 3 3  0 0 0 0 0 0 3     Review of Systems  All other systems reviewed and are negative.    Allergies  Doxycycline; Aspirin; and Ibuprofen  Home Medications   Current Outpatient Rx  Name  Route  Sig  Dispense  Refill  . ranitidine (ZANTAC) 300 MG tablet   Oral   Take 300 mg by mouth at bedtime.         . Cetirizine HCl 10 MG CAPS   Oral   Take 1 capsule (10 mg total) by mouth daily.   30 capsule   5   . cyclobenzaprine (FLEXERIL) 5 MG tablet   Oral   Take 1 tablet (5 mg total) by mouth at bedtime as needed for muscle spasms.   15 tablet   1   . EXPIRED:  fluticasone (FLONASE) 50 MCG/ACT nasal spray   Nasal   Place 2 sprays into the nose daily.   16 g   2   . furosemide (LASIX) 20 MG tablet   Oral   Take 1 tablet (20 mg total) by mouth 2 (two) times daily.   30 tablet   1   . HYDROcodone-acetaminophen (NORCO) 5-325 MG per tablet   Oral   Take 1 tablet by mouth every 8 (eight) hours as needed for pain.   15 tablet   0   . ibuprofen (ADVIL,MOTRIN) 600 MG tablet   Oral   Take 1 tablet (600 mg total) by mouth every 6 (six) hours.   30 tablet   1   . labetalol (NORMODYNE) 100 MG tablet   Oral   Take 1 tablet (100 mg total) by mouth 2 (two) times daily.   60 tablet   0   . oxyCODONE-acetaminophen (PERCOCET/ROXICET) 5-325 MG per tablet   Oral   Take 1-2 tablets by mouth every 4 (four) hours as needed.   30 tablet   0   . pantoprazole (PROTONIX) 20 MG tablet   Oral  Take 20 mg by mouth daily.         . potassium chloride (K-DUR) 10 MEQ tablet   Oral   Take 2 tablets (20 mEq total) by mouth 2 (two) times daily.   120 tablet   2   . predniSONE (DELTASONE) 20 MG tablet   Oral   Take 1 tablet (20 mg total) by mouth daily.   6 tablet   1   . Prenatal Vit-Fe Fumarate-FA (MULTIVITAMIN-PRENATAL) 27-0.8 MG TABS   Oral   Take 1 tablet by mouth daily.          BP 121/83  Pulse 55  Temp(Src) 97.8 F (36.6 C) (Oral)  Resp 18  SpO2 97%  LMP 09/22/2013 Physical Exam  Nursing note and vitals reviewed. Constitutional: She is oriented to person, place, and time. She appears well-developed and well-nourished.  HENT:  Head: Normocephalic.  Right Ear: External ear normal.  Eyes: Conjunctivae and EOM are normal. Pupils are equal, round, and reactive to light.  Neck: Normal range of motion.  Pulmonary/Chest: Effort normal.  Abdominal: Soft. She exhibits no distension.  Musculoskeletal: Normal range of motion.  Neurological: She is alert and oriented to person, place, and time.  Skin: Skin is warm.  Psychiatric: She  has a normal mood and affect.    ED Course  Procedures (including critical care time) Labs Review Labs Reviewed - No data to display Imaging Review No results found.   MDM  No diagnosis found. Pt feels better.  Pt advised follow up with her MD for evaluation   Elson AreasLeslie K Texie Tupou, PA-C 10/20/13 1049

## 2013-10-20 NOTE — ED Notes (Signed)
Provided pillow and warm blankets, repositioned, and dimmed lights

## 2013-10-20 NOTE — ED Notes (Signed)
Reports no diagnosis of migraine, but mother and son have been diagnosed with migraines.  Patient reports headache started 1 pm on 3/7.  Pain in left side of head, nausea and vomiting, sensitive to kight, having spots and flashes in field of vision.  No known injury or illness.

## 2013-10-20 NOTE — ED Provider Notes (Signed)
Medical screening examination/treatment/procedure(s) were performed by non-physician practitioner and as supervising physician I was immediately available for consultation/collaboration.  Eleanor Gatliff, M.D.  Eustolia Drennen C Hallel Denherder, MD 10/20/13 1050 

## 2013-11-10 ENCOUNTER — Encounter (HOSPITAL_COMMUNITY): Payer: Self-pay | Admitting: Emergency Medicine

## 2013-11-10 ENCOUNTER — Emergency Department (INDEPENDENT_AMBULATORY_CARE_PROVIDER_SITE_OTHER)
Admission: EM | Admit: 2013-11-10 | Discharge: 2013-11-10 | Disposition: A | Discharge status: Home / Self Care | Payer: BC Managed Care – PPO | Source: Home / Self Care | Attending: Family Medicine | Admitting: Family Medicine

## 2013-11-10 DIAGNOSIS — H6980 Other specified disorders of Eustachian tube, unspecified ear: Secondary | ICD-10-CM

## 2013-11-10 DIAGNOSIS — H6501 Acute serous otitis media, right ear: Secondary | ICD-10-CM

## 2013-11-10 DIAGNOSIS — H6500 Acute serous otitis media, unspecified ear: Secondary | ICD-10-CM

## 2013-11-10 MED ORDER — FLUTICASONE PROPIONATE 50 MCG/ACT NA SUSP: 2.0000 | Freq: Every day | NASAL | Status: DC

## 2013-11-10 NOTE — Discharge Instructions (Signed)
Please use medication as directed. You may also use an over the counter oral decongestant such as Sudafed to help clear some of the fluid from your right ear. If symptoms do not improve, please follow up with your doctor.

## 2013-11-10 NOTE — ED Provider Notes (Signed)
CSN: [id number]     Arrival date & time 11/10/13  1211 History   First MD Initiated Contact with Patient 11/10/13 1349     Chief Complaint  Patient presents with  . Otalgia   (Consider location/radiation/quality/duration/timing/severity/associated sxs/prior Treatment) Patient is a 38 y.o. female presenting with ear pain. The history is provided by the patient.  Otalgia Location:  Right Behind ear:  No abnormality Quality:  Throbbing and pressure Severity:  Mild Onset quality:  Gradual Duration:  1 day Timing:  Constant Progression:  Unchanged Chronicity:  New Context: not direct blow, not elevation change, not foreign body in ear, not loud noise and no water in ear   Associated symptoms: no congestion, no cough, no ear discharge, no fever, no headaches, no rhinorrhea, no sore throat, no tinnitus and no vomiting   Associated symptoms comment:  +describes hearing as "muffled" in right ear Risk factors: no recent travel, no chronic ear infection and no prior ear surgery     Past Medical History  Diagnosis Date  . Reflux   . Asthma   . Hypertension   . Albinism   . Nystagmus   . Depression   . Pregnancy induced hypertension    Past Surgical History  Procedure Laterality Date  . Tooth extraction    . Upper gastrointestinal endoscopy     Family History  Problem Relation Age of Onset  . Hypertension Father   . Cancer Father     lung  . Heart disease Maternal Grandmother   . Cancer Maternal Grandfather     lung   History  Substance Use Topics  . Smoking status: Never Smoker   . Smokeless tobacco: Never Used  . Alcohol Use: Yes     Comment: occasional   OB History   Grav Para Term Preterm Abortions TAB SAB Ect Mult Living   3 3 3  0 0 0 0 0 0 3     Review of Systems  Constitutional: Negative for fever.  HENT: Positive for ear pain. Negative for congestion, ear discharge, rhinorrhea, sore throat and tinnitus.   Respiratory: Negative for cough.   Gastrointestinal:  Negative for vomiting.  Neurological: Negative for headaches.  All other systems reviewed and are negative.    Allergies  Doxycycline; Aspirin; and Ibuprofen  Home Medications   Current Outpatient Rx  Name  Route  Sig  Dispense  Refill  . Cetirizine HCl 10 MG CAPS   Oral   Take 1 capsule (10 mg total) by mouth daily.   30 capsule   5   . cyclobenzaprine (FLEXERIL) 5 MG tablet   Oral   Take 1 tablet (5 mg total) by mouth at bedtime as needed for muscle spasms.   15 tablet   1   . EXPIRED: fluticasone (FLONASE) 50 MCG/ACT nasal spray   Nasal   Place 2 sprays into the nose daily.   16 g   2   . fluticasone (FLONASE) 50 MCG/ACT nasal spray   Each Nare   Place 2 sprays into both nostrils daily.   16 g   0   . furosemide (LASIX) 20 MG tablet   Oral   Take 1 tablet (20 mg total) by mouth 2 (two) times daily.   30 tablet   1   . HYDROcodone-acetaminophen (NORCO) 5-325 MG per tablet   Oral   Take 1 tablet by mouth every 8 (eight) hours as needed for pain.   15 tablet   0   .  ibuprofen (ADVIL,MOTRIN) 600 MG tablet   Oral   Take 1 tablet (600 mg total) by mouth every 6 (six) hours.   30 tablet   1   . labetalol (NORMODYNE) 100 MG tablet   Oral   Take 1 tablet (100 mg total) by mouth 2 (two) times daily.   60 tablet   0   . oxyCODONE-acetaminophen (PERCOCET/ROXICET) 5-325 MG per tablet   Oral   Take 1-2 tablets by mouth every 4 (four) hours as needed.   30 tablet   0   . pantoprazole (PROTONIX) 20 MG tablet   Oral   Take 20 mg by mouth daily.         . potassium chloride (K-DUR) 10 MEQ tablet   Oral   Take 2 tablets (20 mEq total) by mouth 2 (two) times daily.   120 tablet   2   . predniSONE (DELTASONE) 20 MG tablet   Oral   Take 1 tablet (20 mg total) by mouth daily.   6 tablet   1   . Prenatal Vit-Fe Fumarate-FA (MULTIVITAMIN-PRENATAL) 27-0.8 MG TABS   Oral   Take 1 tablet by mouth daily.         . ranitidine (ZANTAC) 300 MG tablet    Oral   Take 300 mg by mouth at bedtime.          BP 122/61  Pulse 50  Temp(Src) 97.9 F (36.6 C) (Oral)  Resp 16  SpO2 99%  LMP 09/22/2013 Physical Exam  Nursing note and vitals reviewed. Constitutional: She is oriented to person, place, and time. She appears well-developed and well-nourished. No distress.  HENT:  Head: Normocephalic and atraumatic.  Right Ear: External ear and ear canal normal. No lacerations. No drainage, swelling or tenderness. No foreign bodies. No mastoid tenderness. Tympanic membrane is not injected, not scarred, not perforated and not erythematous. A middle ear effusion is present. No hemotympanum. Decreased hearing is noted.  Left Ear: Hearing, tympanic membrane, external ear and ear canal normal.  Nose: Nose normal.  Mouth/Throat: Uvula is midline, oropharynx is clear and moist and mucous membranes are normal.  +moderate clear fluid behind right TM  Eyes: Conjunctivae are normal. Right eye exhibits no discharge. Left eye exhibits no discharge. No scleral icterus.  Neck: Normal range of motion. Neck supple.  Cardiovascular: Normal rate and regular rhythm.   Pulmonary/Chest: Effort normal and breath sounds normal.  Musculoskeletal: Normal range of motion.  Lymphadenopathy:    She has no cervical adenopathy.  Neurological: She is alert and oriented to person, place, and time.  Skin: Skin is warm and dry.  Psychiatric: She has a normal mood and affect. Her behavior is normal.    ED Course  Procedures (including critical care time) Labs Review Labs Reviewed - No data to display Imaging Review No results found.   MDM   1. Acute serous otitis media of right ear   Flonase as prescribed with OTC decongestants to try to open right eustachian tube to clear fluid from right middle ear. Tylenol or ibuprofen as needed for discomfort. Follow up PCP if no improvement.    Jess BartersJennifer Lee Grover HillPresson, GeorgiaPA 11/10/13 2052

## 2013-11-10 NOTE — ED Notes (Signed)
C/o right ear pain States hearing is muffled and she has pressure, congestion and ear throbbing  No treatments

## 2013-11-13 NOTE — ED Provider Notes (Signed)
Medical screening examination/treatment/procedure(s) were performed by a resident physician or non-physician practitioner and as the supervising physician I was immediately available for consultation/collaboration.  Dov Dill, MD   Fany Cavanaugh S Cierah Crader, MD 11/13/13 0748 

## 2014-02-23 ENCOUNTER — Ambulatory Visit (INDEPENDENT_AMBULATORY_CARE_PROVIDER_SITE_OTHER): Payer: BC Managed Care – PPO | Admitting: Emergency Medicine

## 2014-02-23 VITALS — BP 112/62 | HR 56 | Temp 98.0°F | Resp 14 | Ht 64.5 in | Wt 157.0 lb

## 2014-02-23 DIAGNOSIS — E7030 Albinism, unspecified: Secondary | ICD-10-CM

## 2014-02-23 DIAGNOSIS — E708 Other disorders of aromatic amino-acid metabolism: Secondary | ICD-10-CM

## 2014-02-23 DIAGNOSIS — J018 Other acute sinusitis: Secondary | ICD-10-CM

## 2014-02-23 DIAGNOSIS — E7089 Other disorders of aromatic amino-acid metabolism: Secondary | ICD-10-CM

## 2014-02-23 MED ORDER — AMOXICILLIN-POT CLAVULANATE 875-125 MG PO TABS: 1.0000 | ORAL_TABLET | Freq: Two times a day (BID) | ORAL | Status: DC

## 2014-02-23 NOTE — Progress Notes (Signed)
[facility] [address], [place] [address] [phone]  Date:  02/23/2014   Name:  Kimberly Fernandez   DOB:  08/01/1976   MRN:  [id number]  PCP:  Juline Patch, MD    Chief Complaint: Otalgia, Nasal Congestion and Dizziness   History of Present Illness:  Kimberly Fernandez is a 38 y.o. very pleasant female patient who presents with the following:  Ill since Saturday last week with nasal congestion and drainage of purulent discharge.  Non productive cough.  No pain or pressure in sinuses.  Pressure in right ear.  No fever or chills.  No nausea or vomiting.  No rash.  No stool change.  No wheezing or shortness of breath.  No improvement with over the counter medications or other home remedies. Denies other complaint or health concern today.   Patient Active Problem List   Diagnosis Date Noted  . PIH (pregnancy induced hypertension) 10/23/2012  . NSVD (normal spontaneous vaginal delivery) 10/23/2012  . Normal vaginal delivery 02/26/2011    Past Medical History  Diagnosis Date  . Reflux   . Asthma   . Hypertension   . Albinism   . Nystagmus   . Depression   . Pregnancy induced hypertension   . Anxiety     Past Surgical History  Procedure Laterality Date  . Tooth extraction    . Upper gastrointestinal endoscopy    . Eye surgery      History  Substance Use Topics  . Smoking status: Never Smoker   . Smokeless tobacco: Never Used  . Alcohol Use: Yes     Comment: occasional    Family History  Problem Relation Age of Onset  . Hypertension Father   . Cancer Father     lung  . Heart disease Maternal Grandmother   . Cancer Maternal Grandfather     lung    Allergies  Allergen Reactions  . Doxycycline Rash  . Aspirin Other (See Comments)    Thins blood too much. Pt states she can take Motrin.  . Ibuprofen     Medication list has been reviewed and updated.  Current Outpatient Prescriptions on File Prior to Visit  Medication Sig Dispense Refill   . Cetirizine HCl 10 MG CAPS Take 1 capsule (10 mg total) by mouth daily.  30 capsule  5  . cyclobenzaprine (FLEXERIL) 5 MG tablet Take 1 tablet (5 mg total) by mouth at bedtime as needed for muscle spasms.  15 tablet  1  . fluticasone (FLONASE) 50 MCG/ACT nasal spray Place 2 sprays into the nose daily.  16 g  2  . fluticasone (FLONASE) 50 MCG/ACT nasal spray Place 2 sprays into both nostrils daily.  16 g  0  . furosemide (LASIX) 20 MG tablet Take 1 tablet (20 mg total) by mouth 2 (two) times daily.  30 tablet  1  . HYDROcodone-acetaminophen (NORCO) 5-325 MG per tablet Take 1 tablet by mouth every 8 (eight) hours as needed for pain.  15 tablet  0  . ibuprofen (ADVIL,MOTRIN) 600 MG tablet Take 1 tablet (600 mg total) by mouth every 6 (six) hours.  30 tablet  1  . labetalol (NORMODYNE) 100 MG tablet Take 1 tablet (100 mg total) by mouth 2 (two) times daily.  60 tablet  0  . oxyCODONE-acetaminophen (PERCOCET/ROXICET) 5-325 MG per tablet Take 1-2 tablets by mouth every 4 (four) hours as needed.  30 tablet  0  . pantoprazole (PROTONIX) 20 MG tablet Take 20  mg by mouth daily.      . potassium chloride (K-DUR) 10 MEQ tablet Take 2 tablets (20 mEq total) by mouth 2 (two) times daily.  120 tablet  2  . predniSONE (DELTASONE) 20 MG tablet Take 1 tablet (20 mg total) by mouth daily.  6 tablet  1  . Prenatal Vit-Fe Fumarate-FA (MULTIVITAMIN-PRENATAL) 27-0.8 MG TABS Take 1 tablet by mouth daily.      . ranitidine (ZANTAC) 300 MG tablet Take 300 mg by mouth at bedtime.      . [DISCONTINUED] diphenhydramine-acetaminophen (TYLENOL PM) 25-500 MG TABS Take 1 tablet by mouth at bedtime as needed. Patient used medication for sleep.        No current facility-administered medications on file prior to visit.    Review of Systems:  As per HPI, otherwise negative.    Physical Examination: Filed Vitals:   02/23/14 0902  BP: 112/62  Pulse: 56  Temp: 98 F (36.7 C)  Resp: 14   Filed Vitals:   02/23/14 0902   Height: 5' 4.5" (1.638 m)  Weight: 157 lb (71.215 kg)   Body mass index is 26.54 kg/(m^2). Ideal Body Weight: Weight in (lb) to have BMI = 25: 147.6  GEN: WDWN, NAD, Non-toxic, A & O x 3 HEENT: Atraumatic, Normocephalic. Neck supple. No masses, No LAD. Ears and Nose: No external deformity. CV: RRR, No M/G/R. No JVD. No thrill. No extra heart sounds. PULM: CTA B, no wheezes, crackles, rhonchi. No retractions. No resp. distress. No accessory muscle use. ABD: S, NT, ND, +BS. No rebound. No HSM. EXTR: No c/c/e NEURO Normal gait.  PSYCH: Normally interactive. Conversant. Not depressed or anxious appearing.  Calm demeanor.    Assessment and Plan: augmentin Sinusitis  Signed,  Phillips OdorJeffery Consuela Widener, MD

## 2014-02-23 NOTE — Patient Instructions (Signed)
Sinusitis Sinusitis is redness, soreness, and swelling (inflammation) of the paranasal sinuses. Paranasal sinuses are air pockets within the bones of your face (beneath the eyes, the middle of the forehead, or above the eyes). In healthy paranasal sinuses, mucus is able to drain out, and air is able to circulate through them by way of your nose. However, when your paranasal sinuses are inflamed, mucus and air can become trapped. This can allow bacteria and other germs to grow and cause infection. Sinusitis can develop quickly and last only a short time (acute) or continue over a long period (chronic). Sinusitis that lasts for more than 12 weeks is considered chronic.  CAUSES  Causes of sinusitis include:  Allergies.  Structural abnormalities, such as displacement of the cartilage that separates your nostrils (deviated septum), which can decrease the air flow through your nose and sinuses and affect sinus drainage.  Functional abnormalities, such as when the small hairs (cilia) that line your sinuses and help remove mucus do not work properly or are not present. SYMPTOMS  Symptoms of acute and chronic sinusitis are the same. The primary symptoms are pain and pressure around the affected sinuses. Other symptoms include:  Upper toothache.  Earache.  Headache.  Bad breath.  Decreased sense of smell and taste.  A cough, which worsens when you are lying flat.  Fatigue.  Fever.  Thick drainage from your nose, which often is green and may contain pus (purulent).  Swelling and warmth over the affected sinuses. DIAGNOSIS  Your caregiver will perform a physical exam. During the exam, your caregiver may:  Look in your nose for signs of abnormal growths in your nostrils (nasal polyps).  Tap over the affected sinus to check for signs of infection.  View the inside of your sinuses (endoscopy) with a special imaging device with a light attached (endoscope), which is inserted into your  sinuses. If your caregiver suspects that you have chronic sinusitis, one or more of the following tests may be recommended:  Allergy tests.  Nasal culture--A sample of mucus is taken from your nose and sent to a lab and screened for bacteria.  Nasal cytology--A sample of mucus is taken from your nose and examined by your caregiver to determine if your sinusitis is related to an allergy. TREATMENT  Most cases of acute sinusitis are related to a viral infection and will resolve on their own within 10 days. Sometimes medicines are prescribed to help relieve symptoms (pain medicine, decongestants, nasal steroid sprays, or saline sprays).  However, for sinusitis related to a bacterial infection, your caregiver will prescribe antibiotic medicines. These are medicines that will help kill the bacteria causing the infection.  Rarely, sinusitis is caused by a fungal infection. In theses cases, your caregiver will prescribe antifungal medicine. For some cases of chronic sinusitis, surgery is needed. Generally, these are cases in which sinusitis recurs more than 3 times per year, despite other treatments. HOME CARE INSTRUCTIONS   Drink plenty of water. Water helps thin the mucus so your sinuses can drain more easily.  Use a humidifier.  Inhale steam 3 to 4 times a day (for example, sit in the bathroom with the shower running).  Apply a warm, moist washcloth to your face 3 to 4 times a day, or as directed by your caregiver.  Use saline nasal sprays to help moisten and clean your sinuses.  Take over-the-counter or prescription medicines for pain, discomfort, or fever only as directed by your caregiver. SEEK IMMEDIATE MEDICAL CARE IF:    You have increasing pain or severe headaches.  You have nausea, vomiting, or drowsiness.  You have swelling around your face.  You have vision problems.  You have a stiff neck.  You have difficulty breathing. MAKE SURE YOU:   Understand these  instructions.  Will watch your condition.  Will get help right away if you are not doing well or get worse. Document Released: 08/01/2005 Document Revised: 10/24/2011 Document Reviewed: 08/16/2011 Exit[employer] Patient Information 2015 [employer]. This information is not intended to replace advice given to you by your health care provider. Make sure you discuss any questions you have with your health care provider.  

## 2014-03-06 ENCOUNTER — Encounter (HOSPITAL_COMMUNITY): Payer: Self-pay | Admitting: Emergency Medicine

## 2014-03-06 ENCOUNTER — Emergency Department (INDEPENDENT_AMBULATORY_CARE_PROVIDER_SITE_OTHER)
Admission: EM | Admit: 2014-03-06 | Discharge: 2014-03-06 | Disposition: A | Discharge status: Home / Self Care | Payer: BC Managed Care – PPO | Source: Home / Self Care | Attending: Family Medicine | Admitting: Family Medicine

## 2014-03-06 DIAGNOSIS — H60399 Other infective otitis externa, unspecified ear: Secondary | ICD-10-CM

## 2014-03-06 DIAGNOSIS — H6012 Cellulitis of left external ear: Secondary | ICD-10-CM

## 2014-03-06 MED ORDER — NEOMYCIN-POLYMYXIN-HC 3.5-10000-1 OT SUSP: 4.0000 [drp] | Freq: Three times a day (TID) | OTIC | Status: DC

## 2014-03-06 NOTE — Discharge Instructions (Signed)

## 2014-03-06 NOTE — ED Provider Notes (Signed)
Medical screening examination/treatment/procedure(s) were performed by a resident physician or non-physician practitioner and as the supervising physician I was immediately available for consultation/collaboration.  Mykael Batz, MD Family Medicine   Laurin Morgenstern J Dianara Smullen, MD 03/06/14 2142 

## 2014-03-06 NOTE — ED Notes (Signed)
C/o left ear infection States she was dx on Sunday with a sinus infection and was given augmentin Denies any drainage States ear is sensitive to touch Allergy med taking as tx

## 2014-03-06 NOTE — ED Provider Notes (Signed)
CSN: [id number]     Arrival date & time 03/06/14  1914 History   First MD Initiated Contact with Patient 03/06/14 0820     Chief Complaint  Patient presents with  . Otalgia   (Consider location/radiation/quality/duration/timing/severity/associated sxs/prior Treatment) Patient is a 38 y.o. female presenting with ear pain. The history is provided by the patient.  Otalgia Location:  Left Behind ear:  No abnormality Quality:  Sharp Severity:  Mild Onset quality:  Gradual Duration:  4 days Timing:  Constant Progression:  Worsening Chronicity:  New Context: not direct blow, not elevation change and no water in ear   Associated symptoms: no ear discharge, no fever, no hearing loss, no neck pain, no rhinorrhea, no sore throat and no tinnitus     Past Medical History  Diagnosis Date  . Reflux   . Asthma   . Hypertension   . Albinism   . Nystagmus   . Depression   . Pregnancy induced hypertension   . Anxiety    Past Surgical History  Procedure Laterality Date  . Tooth extraction    . Upper gastrointestinal endoscopy    . Eye surgery     Family History  Problem Relation Age of Onset  . Hypertension Father   . Cancer Father     lung  . Heart disease Maternal Grandmother   . Cancer Maternal Grandfather     lung   History  Substance Use Topics  . Smoking status: Never Smoker   . Smokeless tobacco: Never Used  . Alcohol Use: Yes     Comment: occasional   OB History   Grav Para Term Preterm Abortions TAB SAB Ect Mult Living   3 3 3  0 0 0 0 0 0 3     Review of Systems  Constitutional: Negative for fever.  HENT: Positive for ear pain. Negative for ear discharge, hearing loss, rhinorrhea, sore throat and tinnitus.   Musculoskeletal: Negative for neck pain.  All other systems reviewed and are negative.   Allergies  Doxycycline; Aspirin; and Ibuprofen  Home Medications   Prior to Admission medications   Medication Sig Start Date End Date Taking? Authorizing  Provider  amoxicillin-clavulanate (AUGMENTIN) 875-125 MG per tablet Take 1 tablet by mouth 2 (two) times daily. 02/23/14   Phillips Odor, MD  Cetirizine HCl 10 MG CAPS Take 1 capsule (10 mg total) by mouth daily. 07/10/12   Sherren Mocha, MD  cyclobenzaprine (FLEXERIL) 5 MG tablet Take 1 tablet (5 mg total) by mouth at bedtime as needed for muscle spasms. 03/04/13   Elvina Sidle, MD  fexofenadine (ALLEGRA) 180 MG tablet Take 180 mg by mouth daily.    Historical Provider, MD  fluticasone (FLONASE) 50 MCG/ACT nasal spray Place 2 sprays into the nose daily. 07/10/12 07/10/13  Sherren Mocha, MD  fluticasone (FLONASE) 50 MCG/ACT nasal spray Place 2 sprays into both nostrils daily. 11/10/13   Ardis Rowan, PA  furosemide (LASIX) 20 MG tablet Take 1 tablet (20 mg total) by mouth 2 (two) times daily. 10/24/12   Oliver Pila, MD  HYDROcodone-acetaminophen (NORCO) 5-325 MG per tablet Take 1 tablet by mouth every 8 (eight) hours as needed for pain. 03/04/13   Rodolph Bong, MD  ibuprofen (ADVIL,MOTRIN) 600 MG tablet Take 1 tablet (600 mg total) by mouth every 6 (six) hours. 03/04/13   Rodolph Bong, MD  labetalol (NORMODYNE) 100 MG tablet Take 1 tablet (100 mg total) by mouth 2 (two) times daily. 10/24/12  Oliver PilaKathy W Richardson, MD  neomycin-polymyxin-hydrocortisone (CORTISPORIN) 3.5-10000-1 otic suspension Place 4 drops into the left ear 3 (three) times daily. X 7 days 03/06/14   Ardis RowanJennifer Lee Bronwyn Belasco, PA  oxyCODONE-acetaminophen (PERCOCET/ROXICET) 5-325 MG per tablet Take 1-2 tablets by mouth every 4 (four) hours as needed. 10/24/12   Oliver PilaKathy W Richardson, MD  pantoprazole (PROTONIX) 20 MG tablet Take 20 mg by mouth daily.    Historical Provider, MD  potassium chloride (K-DUR) 10 MEQ tablet Take 2 tablets (20 mEq total) by mouth 2 (two) times daily. 10/01/12   Archie PattenNatalie K Frazier, CNM  predniSONE (DELTASONE) 20 MG tablet Take 1 tablet (20 mg total) by mouth daily. 03/04/13   Elvina SidleKurt Lauenstein, MD  Prenatal Vit-Fe  Fumarate-FA (MULTIVITAMIN-PRENATAL) 27-0.8 MG TABS Take 1 tablet by mouth daily.    Historical Provider, MD  ranitidine (ZANTAC) 300 MG tablet Take 300 mg by mouth at bedtime.    Historical Provider, MD   BP 105/61  Pulse 60  Temp(Src) 97.9 F (36.6 C) (Oral)  Resp 16  SpO2 100%  LMP 02/26/2014 Physical Exam  Nursing note and vitals reviewed. Constitutional: She is oriented to person, place, and time. She appears well-developed and well-nourished. No distress.  HENT:  Head: Normocephalic and atraumatic.  Right Ear: Hearing, tympanic membrane, external ear and ear canal normal.  Left Ear: Hearing and tympanic membrane normal. There is tenderness. No drainage or swelling. No mastoid tenderness. Tympanic membrane is not injected and not erythematous.  No middle ear effusion. No decreased hearing is noted.  Nose: Nose normal.  Mouth/Throat: Oropharynx is clear and moist and mucous membranes are normal. No oral lesions.  Tender erythematous floor of left ear canal. Minimal STS. No white exudate but trace of yellow crusted material over area of erythema.   Eyes: Conjunctivae are normal. No scleral icterus.  Neck: Normal range of motion. Neck supple.  Cardiovascular: Normal rate.   Pulmonary/Chest: Effort normal.  Lymphadenopathy:    She has no cervical adenopathy.  Neurological: She is alert and oriented to person, place, and time.  Skin: Skin is warm and dry.  Psychiatric: She has a normal mood and affect. Her behavior is normal.    ED Course  Procedures (including critical care time) Labs Review Labs Reviewed - No data to display  Imaging Review No results found.   MDM   1. Cellulitis of ear canal, left    Mild cellulitis of left external ear canal. Will treat with cortisporin otic and advise PCP follow up if no improvement. No clinical evidence of mastoiditis.    Jess BartersJennifer Lee RavannaPresson, GeorgiaPA 03/06/14 [phone]

## 2014-03-08 ENCOUNTER — Ambulatory Visit (INDEPENDENT_AMBULATORY_CARE_PROVIDER_SITE_OTHER): Payer: BC Managed Care – PPO | Admitting: Family Medicine

## 2014-03-08 VITALS — BP 102/60 | HR 50 | Temp 98.1°F | Resp 14 | Ht 64.0 in | Wt 157.2 lb

## 2014-03-08 DIAGNOSIS — H6690 Otitis media, unspecified, unspecified ear: Secondary | ICD-10-CM

## 2014-03-08 DIAGNOSIS — H6692 Otitis media, unspecified, left ear: Secondary | ICD-10-CM

## 2014-03-08 DIAGNOSIS — H60399 Other infective otitis externa, unspecified ear: Secondary | ICD-10-CM

## 2014-03-08 DIAGNOSIS — H9202 Otalgia, left ear: Secondary | ICD-10-CM

## 2014-03-08 DIAGNOSIS — H9209 Otalgia, unspecified ear: Secondary | ICD-10-CM

## 2014-03-08 DIAGNOSIS — H60392 Other infective otitis externa, left ear: Secondary | ICD-10-CM

## 2014-03-08 MED ORDER — AZITHROMYCIN 250 MG PO TABS: ORAL_TABLET | ORAL | Status: DC

## 2014-03-08 NOTE — Patient Instructions (Signed)
Otitis Media Otitis media is redness, soreness, and inflammation of the middle ear. Otitis media may be caused by allergies or, most commonly, by infection. Often it occurs as a complication of the common cold. SIGNS AND SYMPTOMS Symptoms of otitis media may include:  Earache.  Fever.  Ringing in your ear.  Headache.  Leakage of fluid from the ear. DIAGNOSIS To diagnose otitis media, your health care provider will examine your ear with an otoscope. This is an instrument that allows your health care provider to see into your ear in order to examine your eardrum. Your health care provider also will ask you questions about your symptoms. TREATMENT  Typically, otitis media resolves on its own within 3-5 days. Your health care provider may prescribe medicine to ease your symptoms of pain. If otitis media does not resolve within 5 days or is recurrent, your health care provider may prescribe antibiotic medicines if he or she suspects that a bacterial infection is the cause. HOME CARE INSTRUCTIONS   If you were prescribed an antibiotic medicine, finish it all even if you start to feel better.  Take medicines only as directed by your health care provider.  Keep all follow-up visits as directed by your health care provider. SEEK MEDICAL CARE IF:  You have otitis media only in one ear, or bleeding from your nose, or both.  You notice a lump on your neck.  You are not getting better in 3-5 days.  You feel worse instead of better. SEEK IMMEDIATE MEDICAL CARE IF:   You have pain that is not controlled with medicine.  You have swelling, redness, or pain around your ear or stiffness in your neck.  You notice that part of your face is paralyzed.  You notice that the bone behind your ear (mastoid) is tender when you touch it. MAKE SURE YOU:   Understand these instructions.  Will watch your condition.  Will get help right away if you are not doing well or get worse. Document Released:  05/06/2004 Document Revised: 12/16/2013 Document Reviewed: 02/26/2013 ExitCare Patient Information 2015 [employer]. This information is not intended to replace advice given to you by your health care provider. Make sure you discuss any questions you have with your health care provider. Otitis Externa Otitis externa is a bacterial or fungal infection of the outer ear canal. This is the area from the eardrum to the outside of the ear. Otitis externa is sometimes called "swimmer's ear." CAUSES  Possible causes of infection include:  Swimming in dirty water.  Moisture remaining in the ear after swimming or bathing.  Mild injury (trauma) to the ear.  Objects stuck in the ear (foreign body).  Cuts or scrapes (abrasions) on the outside of the ear. SIGNS AND SYMPTOMS  The first symptom of infection is often itching in the ear canal. Later signs and symptoms may include swelling and redness of the ear canal, ear pain, and yellowish-white fluid (pus) coming from the ear. The ear pain may be worse when pulling on the earlobe. DIAGNOSIS  Your health care provider will perform a physical exam. A sample of fluid may be taken from the ear and examined for bacteria or fungi. TREATMENT  Antibiotic ear drops are often given for 10 to 14 days. Treatment may also include pain medicine or corticosteroids to reduce itching and swelling. HOME CARE INSTRUCTIONS   Apply antibiotic ear drops to the ear canal as prescribed by your health care provider.  Take medicines only as directed by   your health care provider.  If you have diabetes, follow any additional treatment instructions from your health care provider.  Keep all follow-up visits as directed by your health care provider. PREVENTION   Keep your ear dry. Use the corner of a towel to absorb water out of the ear canal after swimming or bathing.  Avoid scratching or putting objects inside your ear. This can damage the ear canal or remove the  protective wax that lines the canal. This makes it easier for bacteria and fungi to grow.  Avoid swimming in lakes, polluted water, or poorly chlorinated pools.  You may use ear drops made of rubbing alcohol and vinegar after swimming. Combine equal parts of white vinegar and alcohol in a bottle. Put 3 or 4 drops into each ear after swimming. SEEK MEDICAL CARE IF:   You have a fever.  Your ear is still red, swollen, painful, or draining pus after 3 days.  Your redness, swelling, or pain gets worse.  You have a severe headache.  You have redness, swelling, pain, or tenderness in the area behind your ear. MAKE SURE YOU:   Understand these instructions.  Will watch your condition.  Will get help right away if you are not doing well or get worse. Document Released: 08/01/2005 Document Revised: 12/16/2013 Document Reviewed: 08/18/2011 ExitCare Patient Information 2015 [employer]. This information is not intended to replace advice given to you by your health care provider. Make sure you discuss any questions you have with your health care provider.  

## 2014-03-08 NOTE — Progress Notes (Signed)
Chief Complaint:  Chief Complaint  Patient presents with  . Otalgia    Left    HPI: Kimberly Fernandez is a 38 y.o. female who is here for  Left ear pain, s/p sinus infection.  She has had no fevers or chills. She has had jaw pain.  She has been on Augmentin and then went tio urgent care and was given neomycin [place] drops , [place] She does do a lot of water aerobics;   Past Medical History  Diagnosis Date  . Reflux   . Asthma   . Hypertension   . Albinism   . Nystagmus   . Depression   . Pregnancy induced hypertension   . Anxiety    Past Surgical History  Procedure Laterality Date  . Tooth extraction    . Upper gastrointestinal endoscopy    . Eye surgery     History   Social History  . Marital Status: Married    Spouse Name: N/A    Number of Children: N/A  . Years of Education: N/A   Social History Main Topics  . Smoking status: Never Smoker   . Smokeless tobacco: Never Used  . Alcohol Use: Yes     Comment: occasional  . Drug Use: No  . Sexual Activity: Yes    Birth Control/ Protection: None     Comment: NFP   Other Topics Concern  . None   Social History Narrative  . None   Family History  Problem Relation Age of Onset  . Hypertension Father   . Cancer Father     lung  . Heart disease Maternal Grandmother   . Cancer Maternal Grandfather     lung   Allergies  Allergen Reactions  . Doxycycline Rash  . Aspirin Other (See Comments)    Thins blood too much. Pt states she can take Motrin.  . Ibuprofen    Prior to Admission medications   Medication Sig Start Date End Date Taking? Authorizing Provider  fexofenadine (ALLEGRA) 180 MG tablet Take 180 mg by mouth daily.   Yes Historical Provider, MD  neomycin-polymyxin-hydrocortisone (CORTISPORIN) 3.5-10000-1 otic suspension Place 4 drops into the left ear 3 (three) times daily. X 7 days 03/06/14  Yes Jess Barters Demarest, PA     ROS: The patient denies  fevers, chills, night sweats, unintentional weight loss, chest pain, palpitations, wheezing, dyspnea on exertion, nausea, vomiting, abdominal pain, dysuria, hematuria, melena, numbness, weakness, or tingling.  All other systems have been reviewed and were otherwise negative with the exception of those mentioned in the HPI and as above.    PHYSICAL EXAM: Filed Vitals:   03/08/14 1308  BP: 102/60  Pulse: 50  Temp: 98.1 F (36.7 C)  Resp: 14   Filed Vitals:   03/08/14 1308  Height: 5\' 4"  (1.626 m)  Weight: 157 lb 4 oz (71.328 kg)   Body mass index is 26.98 kg/(m^2).  General: Alert, no acute distress HEENT:  Normocephalic, atraumatic, oropharynx patent. EOMI, PERRLA.  Left TM dull, no mensicus,erythematous external canal Cardiovascular:  Regular rate and rhythm, no rubs murmurs or gallops.  No Carotid bruits, radial pulse intact. No pedal edema.  Respiratory: Clear to auscultation bilaterally.  No wheezes, rales, or rhonchi.  No cyanosis, no use of accessory musculature GI: No organomegaly, abdomen is soft and non-tender, positive bowel sounds.  No masses. Skin: No rashes. Neurologic: Facial musculature symmetric. Psychiatric: Patient is appropriate throughout  our interaction. Lymphatic: No cervical lymphadenopathy Musculoskeletal: Gait intact.   LABS: Results for orders placed during the hospital encounter of 01/12/13  POCT RAPID STREP A ([facility]RG CARE ONLY)      Result Value Ref Range   Streptococcus, Group A Screen (Direct) NEGATIVE  NEGATIVE     EKG/XRAY:   [id number]   ASSESSMENT/PLAN: Encounter Diagnoses  Name Primary?  . Otalgia of left ear Yes  . Otitis, externa, infective, left   . Subacute otitis media of left ear, recurrence not specified, unspecified otitis media type    Rx Azithromycin C/w Neomycin drops Avoid water F/u prn  Gross sideeffects, risk and benefits, and alternatives of medications d/w patient. Patient is  aware that all medications have potential sideeffects and we are unable to predict every sideeffect or drug-drug interaction that may occur.  LE, THAO PHUONG, DO 03/08/2014 1:56 PM

## 2014-07-15 ENCOUNTER — Encounter (HOSPITAL_COMMUNITY): Payer: Self-pay | Admitting: Emergency Medicine

## 2014-07-15 ENCOUNTER — Emergency Department (INDEPENDENT_AMBULATORY_CARE_PROVIDER_SITE_OTHER)
Admission: EM | Admit: 2014-07-15 | Discharge: 2014-07-15 | Disposition: A | Discharge status: Home / Self Care | Payer: BC Managed Care – PPO | Source: Home / Self Care

## 2014-07-15 DIAGNOSIS — G43009 Migraine without aura, not intractable, without status migrainosus: Secondary | ICD-10-CM

## 2014-07-15 MED ORDER — METHYLPREDNISOLONE ACETATE PF 80 MG/ML IJ SUSP
80.0000 mg | Freq: Once | INTRAMUSCULAR | Status: AC
  Administered 2014-07-15: 80 mg via INTRAMUSCULAR

## 2014-07-15 MED ORDER — DIPHENHYDRAMINE HCL 50 MG/ML IJ SOLN
INTRAMUSCULAR | Status: AC
  Filled 2014-07-15: qty 1

## 2014-07-15 MED ORDER — AMITRIPTYLINE HCL 50 MG PO TABS: 50.0000 mg | ORAL_TABLET | Freq: Every day | ORAL | Status: DC

## 2014-07-15 MED ORDER — METHYLPREDNISOLONE ACETATE 80 MG/ML IJ SUSP
INTRAMUSCULAR | Status: AC
  Filled 2014-07-15: qty 1

## 2014-07-15 MED ORDER — DIPHENHYDRAMINE HCL 50 MG/ML IJ SOLN
50.0000 mg | Freq: Once | INTRAMUSCULAR | Status: AC
  Administered 2014-07-15: 50 mg via INTRAMUSCULAR

## 2014-07-15 NOTE — ED Notes (Signed)
Pt states her migraine started yesterday around noon.  She has not been officially diagnosed with migraines but has been "suffering from them for years".  She states her mother and her son have both been diagnosed with migraines.  She tried OTC medications with no relief.

## 2014-07-15 NOTE — ED Notes (Signed)
Pt given two injections.  Will monitor for 15 min for reaction before d/c at 1305.

## 2014-07-15 NOTE — ED Provider Notes (Signed)
CSN: [id number]     Arrival date & time 07/15/14  1059 History   None    Chief Complaint  Patient presents with  . Migraine   (Consider location/radiation/quality/duration/timing/severity/associated sxs/prior Treatment) Patient is a 38 y.o. female presenting with migraines. The history is provided by the patient.  Migraine This is a new problem. The current episode started 2 days ago (often assoc with weather change, son and mother with headaches.similar.). The problem has not changed since onset.Associated symptoms include headaches. Pertinent negatives include no chest pain and no abdominal pain.    Past Medical History  Diagnosis Date  . Reflux   . Asthma   . Hypertension   . Albinism   . Nystagmus   . Depression   . Pregnancy induced hypertension   . Anxiety    Past Surgical History  Procedure Laterality Date  . Tooth extraction    . Upper gastrointestinal endoscopy    . Eye surgery     Family History  Problem Relation Age of Onset  . Hypertension Father   . Cancer Father     lung  . Heart disease Maternal Grandmother   . Cancer Maternal Grandfather     lung  . Migraines Mother   . Migraines Son    History  Substance Use Topics  . Smoking status: Never Smoker   . Smokeless tobacco: Never Used  . Alcohol Use: Yes     Comment: occasional   OB History    Gravida Para Term Preterm AB TAB SAB Ectopic Multiple Living   3 3 3  0 0 0 0 0 0 3     Review of Systems  Constitutional: Negative for fever and chills.  Eyes: Positive for photophobia.  Respiratory: Negative.   Cardiovascular: Negative for chest pain.  Gastrointestinal: Positive for nausea. Negative for abdominal pain.  Neurological: Positive for headaches.    Allergies  Doxycycline; Aspirin; and Ibuprofen  Home Medications   Prior to Admission medications   Medication Sig Start Date End Date Taking? Authorizing Provider  b complex vitamins tablet Take 1 tablet by mouth daily.   Yes Historical  Provider, MD  Ibuprofen (ADVIL MIGRAINE) 200 MG CAPS Take 800 mg by mouth.   Yes Historical Provider, MD  Norethindrone (HEATHER PO) Take by mouth.   Yes Historical Provider, MD  amitriptyline (ELAVIL) 50 MG tablet Take 1 tablet (50 mg total) by mouth at bedtime. 07/15/14   Linna HoffJames D Terris Germano, MD  azithromycin (ZITHROMAX) 250 MG tablet Take 2 tabs PO now then 1 tab po daily for the next 4 days. 03/08/14   Thao P Le, DO  fexofenadine (ALLEGRA) 180 MG tablet Take 180 mg by mouth daily.    Historical Provider, MD  neomycin-polymyxin-hydrocortisone (CORTISPORIN) 3.5-10000-1 otic suspension Place 4 drops into the left ear 3 (three) times daily. X 7 days 03/06/14   Jess BartersJennifer Lee H Presson, PA   BP 127/73 mmHg  Pulse 42  Temp(Src) 98.3 F (36.8 C) (Oral)  Resp 16  LMP 07/14/2014 (Exact Date) Physical Exam  Constitutional: She is oriented to person, place, and time. She appears well-developed and well-nourished.  HENT:  Head: Normocephalic.  Right Ear: External ear normal.  Left Ear: External ear normal.  Mouth/Throat: Oropharynx is clear and moist.  Eyes: Conjunctivae and EOM are normal. Pupils are equal, round, and reactive to light.  Neck: Normal range of motion. Neck supple.  Cardiovascular: Normal heart sounds.   Abdominal: Soft. Bowel sounds are normal. There is no tenderness.  Lymphadenopathy:    She has no cervical adenopathy.  Neurological: She is alert and oriented to person, place, and time. No cranial nerve deficit. Coordination normal.  Right temporal headache  Skin: Skin is warm and dry.  Nursing note and vitals reviewed.   ED Course  Procedures (including critical care time) Labs Review Labs Reviewed - No data to display  Imaging Review No results found.   MDM   1. Migraine without aura and without status migrainosus, not intractable        Linna HoffJames D Kristain Filo, MD 07/16/14 1734

## 2014-07-15 NOTE — Discharge Instructions (Signed)
See neurologist for further headache eval.

## 2014-08-21 ENCOUNTER — Emergency Department (INDEPENDENT_AMBULATORY_CARE_PROVIDER_SITE_OTHER)
Admission: EM | Admit: 2014-08-21 | Discharge: 2014-08-21 | Disposition: A | Discharge status: Home / Self Care | Payer: BC Managed Care – PPO | Source: Home / Self Care | Attending: Family Medicine | Admitting: Family Medicine

## 2014-08-21 ENCOUNTER — Encounter (HOSPITAL_COMMUNITY): Payer: Self-pay | Admitting: Emergency Medicine

## 2014-08-21 DIAGNOSIS — H6093 Unspecified otitis externa, bilateral: Secondary | ICD-10-CM

## 2014-08-21 MED ORDER — AMOXICILLIN-POT CLAVULANATE 875-125 MG PO TABS: 1.0000 | ORAL_TABLET | Freq: Two times a day (BID) | ORAL | Status: DC

## 2014-08-21 MED ORDER — NEOMYCIN-POLYMYXIN-HC 3.5-10000-1 OT SUSP: 4.0000 [drp] | Freq: Three times a day (TID) | OTIC | Status: DC

## 2014-08-21 NOTE — Discharge Instructions (Signed)
Please start the ear drops This may take 5-7 days to clear Please use a 1:1 combination of H2O2 and water once to twice weekly to prevent future infections Please follow up with ENT Please use the augmentin only if the infection is not cleared by the drops

## 2014-08-21 NOTE — ED Provider Notes (Signed)
CSN: [id number]     Arrival date & time 08/21/14  1419 History   First MD Initiated Contact with Patient 08/21/14 1537     Chief Complaint  Patient presents with  . Otalgia   (Consider location/radiation/quality/duration/timing/severity/associated sxs/prior Treatment) HPI  Ear pain: R and L. Started yesterday. Getting worse. Kept pt up in sleep last night. 3rd episode since September 2015. Pt to f/u w/ ENT in next several weeks. Subjective fever. Associated w/ HA and diminished hearing in R. Denies recent uri symptoms. Improves w/ 800mg  ibuprofen. Worse w/ laying on that ear. No discharge.    Past Medical History  Diagnosis Date  . Reflux   . Asthma   . Albinism   . Nystagmus   . Depression   . Anxiety    Past Surgical History  Procedure Laterality Date  . Tooth extraction    . Upper gastrointestinal endoscopy    . Eye surgery     Family History  Problem Relation Age of Onset  . Hypertension Father   . Cancer Father     lung  . Heart disease Maternal Grandmother   . Cancer Maternal Grandfather     lung  . Migraines Mother   . Migraines Son    History  Substance Use Topics  . Smoking status: Never Smoker   . Smokeless tobacco: Never Used  . Alcohol Use: Yes     Comment: occasional   OB History    Gravida Para Term Preterm AB TAB SAB Ectopic Multiple Living   3 3 3  0 0 0 0 0 0 3     Review of Systems Per HPI with all other pertinent systems negative.    Allergies  Doxycycline; Aspirin; and Ibuprofen  Home Medications   Prior to Admission medications   Medication Sig Start Date End Date Taking? Authorizing Provider  fexofenadine (ALLEGRA) 180 MG tablet Take 180 mg by mouth daily.   Yes Historical Provider, MD  Ibuprofen (ADVIL MIGRAINE) 200 MG CAPS Take 800 mg by mouth.   Yes Historical Provider, MD  Norethindrone (HEATHER PO) Take by mouth.   Yes Historical Provider, MD  amoxicillin-clavulanate (AUGMENTIN) 875-125 MG per tablet Take 1 tablet by mouth 2  (two) times daily. 08/21/14   Ozella Rocksavid J Merrell, MD  b complex vitamins tablet Take 1 tablet by mouth daily.    Historical Provider, MD  neomycin-polymyxin-hydrocortisone (CORTISPORIN) 3.5-10000-1 otic suspension Place 4 drops into the left ear 3 (three) times daily. X 7 days 08/21/14   Ozella Rocksavid J Merrell, MD   BP 117/73 mmHg  Pulse 53  Temp(Src) 99.1 F (37.3 C) (Oral)  Resp 16  SpO2 99%  LMP 08/08/2014 Physical Exam  Constitutional: She is oriented to person, place, and time. She appears well-developed and well-nourished. No distress.  HENT:  R ear canal edematous and erythematous w/ maceration of the skin. TM intact and w/ clear effusion L ear canal w/ erythema and mild edem at the 1-3 oclock positions . TM nml  Neck: Normal range of motion. Neck supple.  Cardiovascular: Normal rate, normal heart sounds and intact distal pulses.   Pulmonary/Chest: Effort normal and breath sounds normal.  Abdominal: Soft. Bowel sounds are normal.  Musculoskeletal: Normal range of motion. She exhibits no edema or tenderness.  Neurological: She is alert and oriented to person, place, and time.  Skin: Skin is warm and dry. She is not diaphoretic.  Psychiatric: She has a normal mood and affect. Her behavior is normal. Judgment and thought content  normal.    ED Course  Procedures (including critical care time) Labs Review Labs Reviewed - No data to display  Imaging Review No results found.   MDM   1. Recurrent otitis externa, bilateral    Start corticosporin After finishing therapy use regular 1:1 H2O2 water mixtures for ear cleaning Augmentin if not improving as h/o recurrent AOM  F/u ENT Precautions given and all questions answered  Shelly Flatten, MD Family Medicine 08/21/2014, 3:54 PM     Ozella Rocks, MD 08/21/14 [phone]

## 2014-08-21 NOTE — ED Notes (Signed)
Pt has been suffering from Right ear pain and ringing in her left ear since yesterday.  She states this is her third ear infection since September.

## 2014-08-27 ENCOUNTER — Other Ambulatory Visit: Payer: Self-pay | Admitting: Neurology

## 2014-08-27 DIAGNOSIS — R519 Headache, unspecified: Secondary | ICD-10-CM

## 2014-08-27 DIAGNOSIS — R51 Headache: Principal | ICD-10-CM

## 2014-08-27 DIAGNOSIS — H5500 Unspecified nystagmus: Secondary | ICD-10-CM

## 2014-09-05 ENCOUNTER — Other Ambulatory Visit: Payer: BC Managed Care – PPO

## 2014-09-16 ENCOUNTER — Encounter (HOSPITAL_COMMUNITY): Payer: Self-pay | Admitting: Emergency Medicine

## 2014-09-16 ENCOUNTER — Emergency Department (INDEPENDENT_AMBULATORY_CARE_PROVIDER_SITE_OTHER)
Admission: EM | Admit: 2014-09-16 | Discharge: 2014-09-16 | Disposition: A | Discharge status: Nursing Home (Includes ICF) | Payer: BC Managed Care – PPO | Source: Home / Self Care | Attending: Family Medicine | Admitting: Family Medicine

## 2014-09-16 ENCOUNTER — Emergency Department (HOSPITAL_COMMUNITY): Payer: BC Managed Care – PPO

## 2014-09-16 ENCOUNTER — Emergency Department (HOSPITAL_COMMUNITY)
Admission: EM | Admit: 2014-09-16 | Discharge: 2014-09-17 | Disposition: A | Discharge status: Home / Self Care | Payer: BC Managed Care – PPO | Attending: Emergency Medicine | Admitting: Emergency Medicine

## 2014-09-16 DIAGNOSIS — R1011 Right upper quadrant pain: Secondary | ICD-10-CM

## 2014-09-16 DIAGNOSIS — Z8669 Personal history of other diseases of the nervous system and sense organs: Secondary | ICD-10-CM | POA: Diagnosis not present

## 2014-09-16 DIAGNOSIS — Z79899 Other long term (current) drug therapy: Secondary | ICD-10-CM | POA: Diagnosis not present

## 2014-09-16 DIAGNOSIS — Z8719 Personal history of other diseases of the digestive system: Secondary | ICD-10-CM | POA: Diagnosis not present

## 2014-09-16 DIAGNOSIS — R1031 Right lower quadrant pain: Secondary | ICD-10-CM | POA: Diagnosis not present

## 2014-09-16 DIAGNOSIS — R197 Diarrhea, unspecified: Secondary | ICD-10-CM | POA: Diagnosis present

## 2014-09-16 DIAGNOSIS — J45909 Unspecified asthma, uncomplicated: Secondary | ICD-10-CM | POA: Diagnosis not present

## 2014-09-16 DIAGNOSIS — Z8639 Personal history of other endocrine, nutritional and metabolic disease: Secondary | ICD-10-CM | POA: Insufficient documentation

## 2014-09-16 DIAGNOSIS — Z3202 Encounter for pregnancy test, result negative: Secondary | ICD-10-CM | POA: Insufficient documentation

## 2014-09-16 DIAGNOSIS — R112 Nausea with vomiting, unspecified: Secondary | ICD-10-CM | POA: Insufficient documentation

## 2014-09-16 DIAGNOSIS — Z8659 Personal history of other mental and behavioral disorders: Secondary | ICD-10-CM | POA: Diagnosis not present

## 2014-09-16 DIAGNOSIS — R109 Unspecified abdominal pain: Secondary | ICD-10-CM

## 2014-09-16 LAB — CBC WITH DIFFERENTIAL/PLATELET
BASOS PCT: 0 % (ref 0–1)
Basophils Absolute: 0 10*3/uL (ref 0.0–0.1)
EOS ABS: 0 10*3/uL (ref 0.0–0.7)
Eosinophils Relative: 0 % (ref 0–5)
HEMATOCRIT: 41.2 % (ref 36.0–46.0)
HEMOGLOBIN: 14.4 g/dL (ref 12.0–15.0)
Lymphocytes Relative: 18 % (ref 12–46)
Lymphs Abs: 1.6 10*3/uL (ref 0.7–4.0)
MCH: 29.6 pg (ref 26.0–34.0)
MCHC: 35 g/dL (ref 30.0–36.0)
MCV: 84.6 fL (ref 78.0–100.0)
MONOS PCT: 8 % (ref 3–12)
Monocytes Absolute: 0.6 10*3/uL (ref 0.1–1.0)
NEUTROS PCT: 74 % (ref 43–77)
Neutro Abs: 6.3 10*3/uL (ref 1.7–7.7)
Platelets: 180 10*3/uL (ref 150–400)
RBC: 4.87 MIL/uL (ref 3.87–5.11)
RDW: 13.8 % (ref 11.5–15.5)
WBC: 8.5 10*3/uL (ref 4.0–10.5)

## 2014-09-16 LAB — COMPREHENSIVE METABOLIC PANEL
ALBUMIN: 3.9 g/dL (ref 3.5–5.2)
ALK PHOS: 62 U/L (ref 39–117)
ALT: 15 U/L (ref 0–35)
ANION GAP: 3 — AB (ref 5–15)
AST: 18 U/L (ref 0–37)
BILIRUBIN TOTAL: 0.7 mg/dL (ref 0.3–1.2)
BUN: 6 mg/dL (ref 6–23)
CALCIUM: 8.9 mg/dL (ref 8.4–10.5)
CO2: 27 mmol/L (ref 19–32)
CREATININE: 0.79 mg/dL (ref 0.50–1.10)
Chloride: 107 mmol/L (ref 96–112)
GLUCOSE: 114 mg/dL — AB (ref 70–99)
Potassium: 3.8 mmol/L (ref 3.5–5.1)
Sodium: 137 mmol/L (ref 135–145)
TOTAL PROTEIN: 7.1 g/dL (ref 6.0–8.3)

## 2014-09-16 LAB — POC URINE PREG, ED: PREG TEST UR: NEGATIVE

## 2014-09-16 MED ORDER — DIPHENHYDRAMINE HCL 50 MG/ML IJ SOLN
12.5000 mg | Freq: Once | INTRAMUSCULAR | Status: AC
  Administered 2014-09-16: 12.5 mg via INTRAVENOUS
  Filled 2014-09-16: qty 1

## 2014-09-16 MED ORDER — HYDROMORPHONE HCL 1 MG/ML IJ SOLN
0.5000 mg | Freq: Once | INTRAMUSCULAR | Status: AC
  Administered 2014-09-16: 0.5 mg via INTRAVENOUS
  Filled 2014-09-16: qty 1

## 2014-09-16 MED ORDER — SODIUM CHLORIDE 0.9 % IV BOLUS (SEPSIS)
1000.0000 mL | INTRAVENOUS | Status: AC
  Administered 2014-09-16: 1000 mL via INTRAVENOUS

## 2014-09-16 MED ORDER — METOCLOPRAMIDE HCL 5 MG/ML IJ SOLN
5.0000 mg | Freq: Once | INTRAMUSCULAR | Status: AC
  Administered 2014-09-16: 5 mg via INTRAVENOUS
  Filled 2014-09-16: qty 2

## 2014-09-16 MED ORDER — IOHEXOL 300 MG/ML  SOLN
25.0000 mL | INTRAMUSCULAR | Status: AC
  Administered 2014-09-16: 25 mL via ORAL

## 2014-09-16 MED ORDER — IOHEXOL 300 MG/ML  SOLN
100.0000 mL | Freq: Once | INTRAMUSCULAR | Status: AC | PRN
  Administered 2014-09-16: 100 mL via INTRAVENOUS

## 2014-09-16 NOTE — ED Notes (Signed)
C/o  On set of discomfort on 1/20 x 1 wk then the following week diarrhea started.   States today has taken max dosage of imodium with no relief of diarrhea.  No nausea or vomiting.  States kids just getting over stomach virus.  States "I have lost 5 pounds in the past week".

## 2014-09-16 NOTE — ED Provider Notes (Signed)
Kimberly Fernandez is a 39 y.o. female who presents to Urgent Care today for Diarrhea and abdominal pain. Patient has had mild intermittent diarrhea since January 20. However yesterday the diarrhea became severe and is associated with severe right upper quadrant abdominal pain. The pain interfered with sleep last night. She's been taking Imodium with no help. No vomiting or blood in the stool.   Past Medical History  Diagnosis Date  . Reflux   . Asthma   . Albinism   . Nystagmus   . Depression   . Anxiety    Past Surgical History  Procedure Laterality Date  . Tooth extraction    . Upper gastrointestinal endoscopy    . Eye surgery     History  Substance Use Topics  . Smoking status: Never Smoker   . Smokeless tobacco: Never Used  . Alcohol Use: Yes     Comment: occasional   ROS as above Medications: No current facility-administered medications for this encounter.   Current Outpatient Prescriptions  Medication Sig Dispense Refill  . b complex vitamins tablet Take 1 tablet by mouth daily.    . Norethindrone (HEATHER PO) Take by mouth.    . SUMAtriptan Succinate (IMITREX PO) Take by mouth.    . topiramate (TOPAMAX) 50 MG tablet Take 50 mg by mouth 2 (two) times daily.    Marland Kitchen. amoxicillin-clavulanate (AUGMENTIN) 875-125 MG per tablet Take 1 tablet by mouth 2 (two) times daily. 20 tablet 0  . fexofenadine (ALLEGRA) 180 MG tablet Take 180 mg by mouth daily.    . Ibuprofen (ADVIL MIGRAINE) 200 MG CAPS Take 800 mg by mouth.    . neomycin-polymyxin-hydrocortisone (CORTISPORIN) 3.5-10000-1 otic suspension Place 4 drops into the left ear 3 (three) times daily. X 7 days 10 mL 0  . [DISCONTINUED] diphenhydramine-acetaminophen (TYLENOL PM) 25-500 MG TABS Take 1 tablet by mouth at bedtime as needed. Patient used medication for sleep.      Allergies  Allergen Reactions  . Doxycycline Rash  . Aspirin Other (See Comments)    Thins blood too much. Pt states she can take Motrin.  . Ibuprofen       Exam:  BP 100/75 mmHg  Pulse 75  Temp(Src) 98.1 F (36.7 C) (Oral)  Resp 16  SpO2 95%  LMP 09/06/2014 Gen: Well NAD HEENT: EOMI,  MMM Lungs: Normal work of breathing. CTABL Heart: RRR no MRG Abd: NABS, tender palpation right upper quadrant. Guarding present. No rebound. Exts: Brisk capillary refill, warm and well perfused.   No results found for this or any previous visit (from the past 24 hour(s)). No results found.  Assessment and Plan: 39 y.o. female with abdominal pain and diarrhea. Unclear etiology. Patient's symptoms are severe. Transfer to ED for evaluation and management.  Discussed warning signs or symptoms. Please see discharge instructions. Patient expresses understanding.     Rodolph BongEvan S Martel Galvan, MD 09/16/14 [id number]

## 2014-09-16 NOTE — ED Notes (Signed)
Patient transported to CT 

## 2014-09-16 NOTE — ED Notes (Addendum)
Pt c/o abd pain and diarrhea x 2 days; [facility]

## 2014-09-16 NOTE — ED Provider Notes (Signed)
CSN: [id number]     Arrival date & time 09/16/14  1825 History   First MD Initiated Contact with Patient 09/16/14 1946     Chief Complaint  Patient presents with  . Diarrhea  . Abdominal Pain     (Consider location/radiation/quality/duration/timing/severity/associated sxs/prior Treatment) Patient is a 39 y.o. female presenting with diarrhea and abdominal pain. The history is provided by the patient.  Diarrhea Quality:  Watery Severity:  Moderate Onset quality:  Sudden Duration:  2 days Timing:  Constant Progression:  Worsening Relieved by:  Nothing Worsened by:  Nothing tried Ineffective treatments:  Anti-motility medications Associated symptoms: abdominal pain and vomiting   Associated symptoms: no fever and no headaches   Abdominal pain:    Location:  RLQ and RUQ   Quality:  Sharp and burning   Severity:  Moderate   Onset quality:  Gradual   Duration: 1-2 weeks.   Timing:  Intermittent   Progression:  Waxing and waning   Chronicity:  New Abdominal Pain Associated symptoms: diarrhea, nausea and vomiting   Associated symptoms: no chest pain, no cough, no dysuria, no fatigue, no fever, no hematuria and no shortness of breath     Past Medical History  Diagnosis Date  . Reflux   . Asthma   . Albinism   . Nystagmus   . Depression   . Anxiety    Past Surgical History  Procedure Laterality Date  . Tooth extraction    . Upper gastrointestinal endoscopy    . Eye surgery     Family History  Problem Relation Age of Onset  . Hypertension Father   . Cancer Father     lung  . Heart disease Maternal Grandmother   . Cancer Maternal Grandfather     lung  . Migraines Mother   . Migraines Son    History  Substance Use Topics  . Smoking status: Never Smoker   . Smokeless tobacco: Never Used  . Alcohol Use: Yes     Comment: occasional   OB History    Gravida Para Term Preterm AB TAB SAB Ectopic Multiple Living   0 0 0 0 0 0 3     Review of Systems   Constitutional: Negative for fever and fatigue.  HENT: Negative for congestion and drooling.   Eyes: Negative for pain.  Respiratory: Negative for cough and shortness of breath.   Cardiovascular: Negative for chest pain.  Gastrointestinal: Positive for nausea, vomiting, abdominal pain and diarrhea.  Genitourinary: Negative for dysuria and hematuria.  Musculoskeletal: Negative for back pain, gait problem and neck pain.  Skin: Negative for color change.  Neurological: Negative for dizziness and headaches.  Hematological: Negative for adenopathy.  Psychiatric/Behavioral: Negative for behavioral problems.  All other systems reviewed and are negative.     Allergies  Doxycycline; Aspirin; and Ibuprofen  Home Medications   Prior to Admission medications   Medication Sig Start Date End Date Taking? Authorizing Provider  amoxicillin-clavulanate (AUGMENTIN) 875-125 MG per tablet Take 1 tablet by mouth 2 (two) times daily. 08/21/14   Ozella Rocks, MD  b complex vitamins tablet Take 1 tablet by mouth daily.    Historical Provider, MD  fexofenadine (ALLEGRA) 180 MG tablet Take 180 mg by mouth daily.    Historical Provider, MD  Ibuprofen (ADVIL MIGRAINE) 200 MG CAPS Take 800 mg by mouth.    Historical Provider, MD  neomycin-polymyxin-hydrocortisone (CORTISPORIN) 3.5-10000-1 otic suspension Place 4 drops into the left ear 3 (three) times  daily. X 7 days 08/21/14   Ozella Rocksavid J Merrell, MD  Norethindrone (HEATHER PO) Take by mouth.    Historical Provider, MD  SUMAtriptan Succinate (IMITREX PO) Take by mouth.    Historical Provider, MD  topiramate (TOPAMAX) 50 MG tablet Take 50 mg by mouth 2 (two) times daily.    Historical Provider, MD   BP 116/60 mmHg  Pulse 80  Temp(Src) 98.2 F (36.8 C) (Oral)  Resp 19  Ht 5\' 4"  (1.626 m)  Wt 150 lb (68.04 kg)  BMI 25.73 kg/m2  SpO2 99%  LMP 09/06/2014 Physical Exam  Constitutional: She is oriented to person, place, and time. She appears well-developed and  well-nourished.  HENT:  Head: Normocephalic.  Mouth/Throat: Oropharynx is clear and moist. No oropharyngeal exudate.  Eyes: Conjunctivae and EOM are normal. Pupils are equal, round, and reactive to light.  Neck: Normal range of motion. Neck supple.  Cardiovascular: Normal rate, regular rhythm, normal heart sounds and intact distal pulses.  Exam reveals no gallop and no friction rub.   No murmur heard. Pulmonary/Chest: Effort normal and breath sounds normal. No respiratory distress. She has no wheezes.  Abdominal: Soft. Bowel sounds are normal. There is tenderness (mild tenderness to palpation of the right-sided abdomen which seems to be worsening right lower quadrant.). There is no rebound and no guarding.  Musculoskeletal: Normal range of motion. She exhibits no edema or tenderness.  Neurological: She is alert and oriented to person, place, and time.  Skin: Skin is warm and dry.  Psychiatric: She has a normal mood and affect. Her behavior is normal.  Nursing note and vitals reviewed.   ED Course  Procedures (including critical care time) Labs Review Labs Reviewed  COMPREHENSIVE METABOLIC PANEL - Abnormal; Notable for the following:    Glucose, Bld 114 (*)    Anion gap 3 (*)    All other components within normal limits  CLOSTRIDIUM DIFFICILE BY PCR  CBC WITH DIFFERENTIAL/PLATELET  GI PATHOGEN PANEL BY PCR, STOOL  POC URINE PREG, ED    Imaging Review Ct Abdomen Pelvis W Contrast  09/17/2014   CLINICAL DATA:  Initial evaluation for right upper quadrant pain and diarrhea for 2 weeks.  EXAM: CT ABDOMEN AND PELVIS WITH CONTRAST  TECHNIQUE: Multidetector CT imaging of the abdomen and pelvis was performed using the standard protocol following bolus administration of intravenous contrast.  CONTRAST:  100mL OMNIPAQUE IOHEXOL 300 MG/ML  SOLN  COMPARISON:  None available  FINDINGS: The visualized lung bases are clear. No pleural or pericardial effusion.  Subcapsular subcentimeter hypodensity  within the inferior left hepatic lobe noted, too small the characterize by CT, but may reflect a small cyst. Liver is otherwise unremarkable. Gallbladder within normal limits. No biliary dilatation. Subcentimeter hypodensity within the posterior aspect of the spleen noted, too small the characterize. Right adrenal gland unremarkable. There is a 9 mm hypodense lesion within the left adrenal gland, indeterminate.  No acute inflammatory changes seen about the pancreas. A somewhat tubular cystic lesion measuring 16 x 19 x 11 mm present within the mid pancreatic body. This lesion is indeterminate, but may reflect a cystic pancreatic neoplasm such as it side branch IPMN. Pancreatic duct itself is of normal caliber.  Kidneys are equal in size with symmetric enhancement. No nephrolithiasis, hydronephrosis, or focal enhancing renal mass.  Stomach within normal limits. No evidence for bowel obstruction. Appendix is not definitely visualize, however, no inflammatory changes are seen about the cecum or within the right lower quadrant to suggest  acute appendicitis.  Bladder within normal limits. E sure contraceptive device is present within the fallopian tubes bilaterally. Ovaries are normal.  Small volume free fluid present within the pelvic cul-de-sac, likely physiologic. No free air. No pathologically enlarged lymph nodes identified within the abdomen and pelvis. Normal intravascular enhancement seen throughout the intra-abdominal aorta and its branch vessels.  No acute osseous abnormality. No worrisome lytic or blastic osseous lesion.  IMPRESSION: 1. No CT evidence for acute intra-abdominal pelvic process. 2. 16 x 19 x 11 mm cystic tubular lesion within the pancreatic body. Finding is indeterminate, but may reflect a cystic pancreatic neoplasm such as an IPMN. Follow-up examination with MRI is recommended for complete characterization. This can be performed on a nonemergent basis. 3. 9 mm hypodense left adrenal nodule,  indeterminate, but statistically likely a small adenoma. This could also be assessed at follow-up MRI. 4. Small volume free fluid within the pelvis, likely physiologic.   Electronically Signed   By: Rise Mu M.D.   On: 09/17/2014 01:07     EKG Interpretation None      MDM   Final diagnoses:  Right sided abdominal pain  Diarrhea    8:07 PM 39 y.o. female who states that she has had abdominal pain and diarrhea that began on January 20. She states that she had symptoms for 4-5 days which seemed to then resolve. She continued to have abdominal pain over the last week but notes that the diarrhea had resolved. Yesterday the diarrhea returned and the abdominal pain worsened. She denies any fevers or vomiting. She is afebrile and vital signs are unremarkable here. Will get pain control with Dilaudid, screening labs, and CT scan of abdomen.  Care transferred to Dr. Norlene Campbell while awaiting CT abd. Pt cont to appear well.   09/17/14 - followed up on c dif which was neg.    Purvis Sheffield, MD 09/17/14 1843

## 2014-09-17 LAB — CLOSTRIDIUM DIFFICILE BY PCR: Toxigenic C. Difficile by PCR: NEGATIVE

## 2014-09-17 MED ORDER — DICYCLOMINE HCL 20 MG PO TABS: 20.0000 mg | ORAL_TABLET | Freq: Four times a day (QID) | ORAL | Status: DC | PRN

## 2014-09-17 MED ORDER — CVS PROBIOTIC MAXIMUM STRENGTH PO CAPS: ORAL_CAPSULE | ORAL | Status: DC

## 2014-09-17 NOTE — Discharge Instructions (Signed)
Stick to a brat diet until feeling better.  Adding probiotics into your diet such as yogurt with active cultures or over-the-counter probiotic supplements may help ease her symptoms.  Follow-up with your Dr. for recheck in 3-5 days.  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  This can be done through your primary care doctor.   Abdominal Pain Many things can cause abdominal pain. Usually, abdominal pain is not caused by a disease and will improve without treatment. It can often be observed and treated at home. Your health care provider will do a physical exam and possibly order blood tests and X-rays to help determine the seriousness of your pain. However, in many cases, more time must pass before a clear cause of the pain can be found. Before that point, your health care provider may not know if you need more testing or further treatment. HOME CARE INSTRUCTIONS  Monitor your abdominal pain for any changes. The following actions may help to alleviate any discomfort you are experiencing:  Only take over-the-counter or prescription medicines as directed by your health care provider.  Do not take laxatives unless directed to do so by your health care provider.  Try a clear liquid diet (broth, tea, or water) as directed by your health care provider. Slowly move to a bland diet as tolerated. SEEK MEDICAL CARE IF:  You have unexplained abdominal pain.  You have abdominal pain associated with nausea or diarrhea.  You have pain when you urinate or have a bowel movement.  You experience abdominal pain that wakes you in the night.  You have abdominal pain that is worsened or improved by eating food.  You have abdominal pain that is worsened with eating fatty foods.  You have a fever. SEEK IMMEDIATE MEDICAL CARE IF:   Your pain does not go away within 2 hours.  You keep throwing up (vomiting).  Your pain is felt only  in portions of the abdomen, such as the right side or the left lower portion of the abdomen.  You pass bloody or black tarry stools. MAKE SURE YOU:  Understand these instructions.   Will watch your condition.   Will get help right away if you are not doing well or get worse.  Document Released: 05/11/2005 Document Revised: 08/06/2013 Document Reviewed: 04/10/2013 [facility] Patient Information 2015 [place], [place]. This information is not intended to replace advice given to you by your health care provider. Make sure you discuss any questions you have with your health care provider.  Diarrhea Diarrhea is frequent loose and watery bowel movements. It can cause you to feel weak and dehydrated. Dehydration can cause you to become tired and thirsty, have a dry mouth, and have decreased urination that often is dark yellow. Diarrhea is a sign of another problem, most often an infection that will not last long. In most cases, diarrhea typically lasts 2-3 days. However, it can last longer if it is a sign of something more serious. It is important to treat your diarrhea as directed by your caregiver to lessen or prevent future episodes of diarrhea. CAUSES  Some common causes include:  Gastrointestinal infections caused by viruses, bacteria, or parasites.  Food poisoning or food allergies.  Certain medicines, such as antibiotics, chemotherapy, and laxatives.  Artificial sweeteners and fructose.  Digestive disorders. HOME CARE INSTRUCTIONS  Ensure adequate fluid intake (hydration): Have 1 cup (8 oz) of fluid for each diarrhea episode.  Avoid fluids that contain simple sugars or sports drinks, fruit juices, whole milk products, and sodas. Your urine should be clear or pale yellow if you are drinking enough fluids. Hydrate with an oral rehydration solution that you can purchase at pharmacies, retail stores, and online. You can prepare an oral rehydration solution at home by mixing the following  ingredients together:   - tsp table salt.   tsp baking soda.   tsp salt substitute containing potassium chloride.  1  tablespoons sugar.  1 L (34 oz) of water.  Certain foods and beverages may increase the speed at which food moves through the gastrointestinal (GI) tract. These foods and beverages should be avoided and include:  Caffeinated and alcoholic beverages.  High-fiber foods, such as raw fruits and vegetables, nuts, seeds, and whole grain breads and cereals.  Foods and beverages sweetened with sugar alcohols, such as xylitol, sorbitol, and mannitol.  Some foods may be well tolerated and may help thicken stool including:  Starchy foods, such as rice, toast, pasta, low-sugar cereal, oatmeal, grits, baked potatoes, crackers, and bagels.  Bananas.  Applesauce.  Add probiotic-rich foods to help increase healthy bacteria in the GI tract, such as yogurt and fermented milk products.  Wash your hands well after each diarrhea episode.  Only take over-the-counter or prescription medicines as directed by your caregiver.  Take a warm bath to relieve any burning or pain from frequent diarrhea episodes. SEEK IMMEDIATE MEDICAL CARE IF:   You are unable to keep fluids down.  You have persistent vomiting.  You have blood in your stool, or your stools are black and tarry.  You do not urinate in 6-8 hours, or there is only a small amount of very dark urine.  You have abdominal pain that increases or localizes.  You have weakness, dizziness, confusion, or light-headedness.  You have a severe headache.  Your diarrhea gets worse or does not get better.  You have a fever or persistent symptoms for more than 2-3 days.  You have a fever and your symptoms suddenly get worse. MAKE SURE YOU:   Understand these instructions.  Will watch your condition.  Will get help right away if you are not doing well or get worse. Document Released: 07/22/2002 Document Revised: 12/16/2013  Document Reviewed: 04/08/2012 [facility] Patient Information 2015 [place], [place]. This information is not intended to replace advice given to you by your health care provider. Make sure you discuss any questions you have with your health care provider.  Food Choices to Help Relieve Diarrhea When you have diarrhea, the foods you eat and your eating habits are very important. Choosing the right foods and drinks can help relieve diarrhea. Also, because diarrhea can last up to 7 days, you need to replace lost fluids and electrolytes (such as sodium, potassium, and chloride) in order to help prevent dehydration.  WHAT GENERAL GUIDELINES DO I NEED TO FOLLOW?  Slowly drink 1 cup (8 oz) of fluid for each episode of diarrhea. If you are getting enough fluid, your urine will be clear or pale yellow.  Eat starchy foods. Some good choices include white rice, white toast, pasta, low-fiber cereal, baked potatoes (without the skin), saltine crackers, and bagels.  Avoid large servings of any cooked vegetables.  Limit fruit to two servings per day. A serving is  cup or 1 small piece.  Choose foods with less than 2 g of fiber per serving.  Limit fats to less than 8 tsp (38 g) per day.  Avoid fried foods.  Eat foods that have probiotics in them. Probiotics can be found in certain dairy products.  Avoid foods and beverages that may increase the speed at which food moves through the stomach and intestines (gastrointestinal tract). Things to avoid include:  High-fiber foods, such as dried fruit, raw fruits and vegetables, nuts, seeds, and whole grain foods.  Spicy foods and high-fat foods.  Foods and beverages sweetened with high-fructose corn syrup, honey, or sugar alcohols such as xylitol, sorbitol, and mannitol. WHAT FOODS ARE RECOMMENDED? Grains White rice. White, [place], or pita breads (fresh or toasted), including plain rolls, buns, or bagels. White pasta. Saltine, soda, or graham crackers. Pretzels.  Low-fiber cereal. Cooked cereals made with water (such as cornmeal, farina, or cream cereals). Plain muffins. Matzo. Melba toast. Zwieback.  Vegetables Potatoes (without the skin). Strained tomato and vegetable juices. Most well-cooked and canned vegetables without seeds. Tender lettuce. Fruits Cooked or canned applesauce, apricots, cherries, fruit cocktail, grapefruit, peaches, pears, or plums. Fresh bananas, apples without skin, cherries, grapes, cantaloupe, grapefruit, peaches, oranges, or plums.  Meat and Other Protein Products Baked or boiled chicken. Eggs. Tofu. Fish. Seafood. Smooth peanut butter. Ground or well-cooked tender beef, ham, veal, lamb, pork, or poultry.  Dairy Plain yogurt, kefir, and unsweetened liquid yogurt. Lactose-free milk, buttermilk, or soy milk. Plain hard cheese. Beverages Sport drinks. Clear broths. Diluted fruit juices (except prune). Regular, caffeine-free sodas such as ginger ale. Water. Decaffeinated teas. Oral rehydration solutions. Sugar-free beverages not sweetened with sugar alcohols. Other Bouillon, broth, or soups made from recommended foods.  The items listed above may not be a complete list of recommended foods or beverages. Contact your dietitian for more options. WHAT FOODS ARE NOT RECOMMENDED? Grains Whole grain, whole wheat, bran, or rye breads, rolls, pastas, crackers, and cereals. Wild or brown rice. Cereals that contain more than 2 g of fiber per serving. Corn tortillas or taco shells. Cooked or dry oatmeal. Granola. Popcorn. Vegetables Raw vegetables. Cabbage, broccoli, Brussels sprouts, artichokes, baked beans, beet greens, corn, kale, legumes, peas, sweet potatoes, and yams. Potato skins. Cooked spinach and cabbage. Fruits Dried fruit, including raisins and dates. Raw fruits. Stewed or dried prunes. Fresh apples with skin, apricots, mangoes, pears, raspberries, and strawberries.  Meat and Other Protein Products Chunky peanut butter. Nuts and  seeds. Beans and lentils. Tomasa Blase.  Dairy High-fat cheeses. Milk, chocolate milk, and beverages made with milk, such as milk shakes. Cream. Ice cream. Sweets and Desserts Sweet rolls, doughnuts, and sweet breads. Pancakes and waffles. Fats and Oils Butter. Cream sauces. Margarine. Salad oils. Plain salad dressings. Olives. Avocados.  Beverages Caffeinated beverages (such as coffee, tea, soda, or energy drinks). Alcoholic beverages. Fruit juices with pulp. Prune juice. Soft drinks sweetened with high-fructose corn syrup or sugar alcohols. Other Coconut. Hot sauce. Chili powder. Mayonnaise. Gravy. Cream-based or milk-based soups.  The items listed above may not be a complete list of foods and beverages to avoid. Contact your dietitian for more information. WHAT SHOULD I DO IF I BECOME DEHYDRATED? Diarrhea can sometimes lead to dehydration. Signs of dehydration include dark urine and dry mouth and skin. If you think you are dehydrated, you should rehydrate with an oral rehydration solution. These solutions can be purchased at pharmacies, retail stores, or online.  Drink -1 cup (120-240 mL) of oral rehydration solution each time you have an episode of diarrhea. If drinking this amount makes your diarrhea worse, try drinking smaller amounts more often. For example, drink 1-3 tsp (5-15 mL)  every 5-10 minutes.  A general rule for staying hydrated is to drink 1-2 L of fluid per day. Talk to your health care provider about the specific amount you should be drinking each day. Drink enough fluids to keep your urine clear or pale yellow. Document Released: 10/22/2003 Document Revised: 08/06/2013 Document Reviewed: 06/24/2013 [facility] Patient Information 2015 [place], [place]. This information is not intended to replace advice given to you by your health care provider. Make sure you discuss any questions you have with your health care provider.    Emergency Department Resource Guide 1) Find a Doctor and Pay  Out of Pocket Although you won't have to find out who is covered by your insurance plan, it is a good idea to ask around and get recommendations. You will then need to call the office and see if the doctor you have chosen will accept you as a new patient and what types of options they offer for patients who are self-pay. Some doctors offer discounts or will set up payment plans for their patients who do not have insurance, but you will need to ask so you aren't surprised when you get to your appointment.  2) Contact Your Local Health Department Not all health departments have doctors that can see patients for sick visits, but many do, so it is worth a call to see if yours does. If you don't know where your local health department is, you can check in your phone book. The CDC also has a tool to help you locate your state's health department, and many state websites also have listings of all of their local health departments.  3) Find a Walk-in Clinic If your illness is not likely to be very severe or complicated, you may want to try a walk in clinic. These are popping up all over the country in pharmacies, drugstores, and shopping centers. They're usually staffed by nurse practitioners or physician assistants that have been trained to treat common illnesses and complaints. They're usually fairly quick and inexpensive. However, if you have serious medical issues or chronic medical problems, these are probably not your best option.  No Primary Care Doctor: - Call Health Connect at  [phone] - they can help you locate a primary care doctor that  accepts your insurance, provides certain services, etc. - Physician Referral Service- [phone]  Chronic Pain Problems: Organization         Address  Phone   Notes  [facility]  [phone] Patients need to be referred by their primary care doctor.   Medication Assistance: Organization         Address  Phone   Notes  [facility]  Medication [facility] [address] [place], [place] [id number] [phone] --Must be a resident of [place] [place] -- Must have NO insurance coverage whatsoever (no Medicaid/ Medicare, etc.) -- The pt. MUST have a primary care doctor that directs their care regularly and follows them in the community   Med[facility]ist  [phone]   [employer]  [phone]    Agencies that provide inexpensive medical care: Organization         Address  Phone   Notes  [facility]  [phone]   [facility]    [phone]   [facility] [address] [place], [place] [address] [phone]   [facility] [address], [place] [phone]   Planned  Parenthood    [phone]   [facility]    [phone]   [facility]  [address], [place] Phone:  [phone], Fax:  [phone] Hours of Operation:  9 am - 6 pm, M-F.  Also accepts Medicaid/Medicare and self-pay.  [facility]  [address], [place] Phone: [phone], Fax: [phone]. Hours of Operation:  8:30 am - 5:30 pm, M-F.  Also accepts Medicaid and self-pay.  [facility] [place] [address] [place] Phone: [phone]   [facility] [address] Natasha Bence [place], [place] [phone], Ext. [phone] Mondays & Thursdays: 7-9 AM.  First 15 patients are seen on a first come, first serve basis.    Medicaid-accepting [facility] Providers:  Organization         Address  Phone   Notes  [facility] [address], [place] [phone] Also accepts self-pay patients.  [facility] [address] Laurell Josephs [place], [place]  [phone]   [facility] [address], [place] [phone]   [facility] [address], [place] [phone]   Renaye Rakers [address], [place]   [phone] Only accepts [place] Access [place] patients after they have their name applied to their card.   Self-Pay (no insurance) in [facility]:  Organization         Address  Phone   Notes  Sickle Cell Patients, [facility] [address] [place], [place] [phone]   [facility] [address] [place], [place] [phone]   [facility] [place]  [address], [place] [phone]   [facility]/Dr. Osei-Bonsu  [address] [place] or [address], [place] [phone] Phone number for both [place] and [place] locations is the same.  [facility] [address], [place] [phone]   [facility] [address] [place] or [address] [phone]   [facility] [address] [place] [phone], phone; [phone], fax Sees patients 1st and 3rd Saturday of every month.  Must not qualify for public or private insurance (i.e. Medicaid, Medicare, [place] Health Choice, Veterans' Benefits)  Household income should be no more than 200% of the poverty level The clinic cannot treat you if you are pregnant or think you are pregnant  Sexually transmitted diseases are not treated at the clinic.    Dental Care: Organization         Address  Phone  Notes  [facility] [address] [place], [place] [phone] Accepts children up to age 73 who are enrolled in [place] or [place] Health Choice; pregnant women with a Medicaid card; and children who have applied for Medicaid or [place] Health Choice, but were declined, whose parents can pay a reduced fee at time of service.  [facility]  [address], [place]  [phone] Accepts children up to age 86 who are enrolled in [place] or [place] Health Choice; pregnant women with a Medicaid card; and children who have applied for Medicaid or  Health Choice, but were declined, whose  parents can pay a reduced fee at time of service.  [facility]  [address] [place], [place] [phone] Patients are seen by appointment only. Walk-ins are not accepted. [facility] will see patients 62 years of age and older. Monday - Tuesday (8am-5pm) Most Wednesdays (8:30-5pm) $30 per visit, cash only  [facility] Adult Dental Access PROGRAM  [address], [facility] [phone] Patients are seen by appointment only. Walk-ins are not accepted. [facility] will see patients 9 years of age and older. One Wednesday Evening (Monthly: Volunteer Based).  $30 per visit, cash only  [employer]  [phone] for adults; Children under age 29, call Graduate Pediatric Dentistry at [phone]. Children aged 25-14, please call [phone] to request a pediatric application.  Dental services are provided in all areas of dental care including fillings, crowns and bridges, complete and partial dentures, implants, gum treatment, root canals, and extractions. Preventive care is also provided. Treatment is provided to both adults and children. Patients are selected via a lottery and there is often a waiting list.   [facility] [address] [place]  [phone] [email or web address]   [facility] [address] [place], [place] [phone], Ext. [phone] Second and Fourth Thursday of each month, opens at 6:30 AM; Clinic ends at 9 AM.  Patients are seen on a first-come first-served basis, and a limited number are seen during each clinic.   [facility]  [address] Ether Griffins [place], [place] [phone]   Eligibility Requirements You must have lived in [place], [place], or [place]  counties for at least the last three months.   You cannot be eligible for state or federal sponsored [place], including [employer], [place], or [employer].   You generally cannot be eligible for healthcare insurance through your employer.    How to apply: Eligibility screenings are held every Tuesday and Wednesday afternoon from 1:00 pm until 4:00 pm. You do not need an appointment for the interview!  [facility] [address] [place], [place] [phone]   [facility] Health Department  [phone]   [facility] Health Department  [phone]   [facility] Health Department  [phone]    Behavioral Health Resources in the Community: Intensive Outpatient Programs Organization         Address  Phone  Notes  [facility] Services [address] [place], [place] [phone]   [facility] Outpatient [address] [place], [place] [phone]   [facility] [address], [place], [place]  [phone]   [facility] [address],  [place], [place] [phone] or [phone]   Substance Abuse Resources Organization         Address  Phone  Notes  [facility]  [phone]   [facility]  [phone]   [facility] [place]  [phone]   Floydene Flock  [phone]   Residential & Outpatient Substance Abuse Program  [phone]   Psychological Services Organization         Address  Phone  Notes  [facility]  [phone]   [facility]  [phone]   [facility] [address] [place] [phone] or [phone]    Mobile Crisis Teams Organization         Address  Phone  Notes  [facility], Mobile Crisis Care  Unit  [phone]   [facility]  [address] [place], [place] [phone]   [facility] [address] [place]  [place] [phone]    Self-Help/Support Groups Organization         Address  Phone             Notes  [facility] [place] - variety of support groups  [phone]- I7437963 Call for more information  Narcotics Anonymous (NA), [facility] [address], [employer] [place]  2 meetings at this location   Statistician         Address  Phone  Notes  [facility] [address],    [place]  [phone]   [facility]  [address] [place] [address], [place], [place] [phone]   [facility] [address] [place], [place] [phone] Admissions: 8am-3pm M-F  [facility] [address],    [place], [place] [phone]   [facility] [address] [place], [place], [place] [phone]   [facility] [address],  [place], [place] [phone]   [facility] - Intensive Outpatient [address] Laurell Joseph[address], [place], [place] [phone]   [facility] (Addiction Recovery [facility] [address] [place].,  [place], [place] [phone] or [phone]   [facility] ([facility]) [address], [place], [place] [phone] Accepts Medicaid  [facility] [address]  [place] [phone] Substance Abuse/Addiction Treatment   [facility] Organization         Address  Phone  Notes  [facility]  [phone]   Angie Fava, PhD [address] [place], [place]   [phone] or [phone]   [facility]   [address] [place], [place] [phone]   [facility] [address], [place], [place] [phone] Insurance/Medicaid/sponsorship through [facility] and Families [address]                                    [place], [place] [phone] Therapy/tele-psych/case  [facility] [address], [place] [phone]    Dr. Lolly Mustache  [phone]   [facility]  [employer] [facility] Dept. 1) [address], [place] 2) [address] [place] 3)  [address], [place] [phone]  [phone]   [facility] Child Abuse Hotline [phone] or [phone] (After Hours)

## 2014-09-17 NOTE — ED Provider Notes (Signed)
Care as soon from Dr. Romeo AppleHarrison at change of shift.  Patient has had intermittent diarrhea over the last 10 days.  No fevers or chills.  Diarrhea returned and worsened today.  She is concern for possible C. difficile exposure.  She reports her children may have come in contact with someone at daycare who had C. difficile.  Children, not currently ill.  She has not been on any antibiotics recently.  Patient was awaiting CT scan.  Results for orders placed or performed during the hospital encounter of 09/16/14  CBC with Differential  Result Value Ref Range   WBC 8.5 4.0 - 10.5 K/uL   RBC 4.87 3.87 - 5.11 MIL/uL   Hemoglobin 14.4 12.0 - 15.0 g/dL   HCT 40.941.2 81.136.0 - 91.446.0 %   MCV 84.6 78.0 - 100.0 fL   MCH 29.6 26.0 - 34.0 pg   MCHC 35.0 30.0 - 36.0 g/dL   RDW 78.213.8 95.611.5 - 21.315.5 %   Platelets 180 150 - 400 K/uL   Neutrophils Relative % 74 43 - 77 %   Neutro Abs 6.3 1.7 - 7.7 K/uL   Lymphocytes Relative 18 12 - 46 %   Lymphs Abs 1.6 0.7 - 4.0 K/uL   Monocytes Relative 8 3 - 12 %   Monocytes Absolute 0.6 0.1 - 1.0 K/uL   Eosinophils Relative 0 0 - 5 %   Eosinophils Absolute 0.0 0.0 - 0.7 K/uL   Basophils Relative 0 0 - 1 %   Basophils Absolute 0.0 0.0 - 0.1 K/uL  Comprehensive metabolic panel  Result Value Ref Range   Sodium 137 135 - 145 mmol/L   Potassium 3.8 3.5 - 5.1 mmol/L   Chloride 107 96 - 112 mmol/L   CO2 27 19 - 32 mmol/L   Glucose, Bld 114 (H) 70 - 99 mg/dL   BUN 6 6 - 23 mg/dL   Creatinine, Ser 0.860.79 0.50 - 1.10 mg/dL   Calcium 8.9 8.4 - 57.810.5 mg/dL   Total Protein 7.1 6.0 - 8.3 g/dL   Albumin 3.9 3.5 - 5.2 g/dL   AST 18 0 - 37 U/L   ALT 15 0 - 35 U/L   Alkaline Phosphatase 62 39 - 117 U/L   Total Bilirubin 0.7 0.3 - 1.2 mg/dL   GFR calc non Af Amer >90 >90 mL/min   GFR calc Af Amer >90 >90 mL/min   Anion gap 3 (L) 5 - 15  POC urine preg, ED (not [facility])  Result Value Ref Range   Preg Test, Ur NEGATIVE NEGATIVE   Ct Abdomen Pelvis W Contrast  09/17/2014   CLINICAL DATA:   Initial evaluation for right upper quadrant pain and diarrhea for 2 weeks.  EXAM: CT ABDOMEN AND PELVIS WITH CONTRAST  TECHNIQUE: Multidetector CT imaging of the abdomen and pelvis was performed using the standard protocol following bolus administration of intravenous contrast.  CONTRAST:  100mL OMNIPAQUE IOHEXOL 300 MG/ML  SOLN  COMPARISON:  None available  FINDINGS: The visualized lung bases are clear. No pleural or pericardial effusion.  Subcapsular subcentimeter hypodensity within the inferior left hepatic lobe noted, too small the characterize by CT, but may reflect a small cyst. Liver is otherwise unremarkable. Gallbladder within normal limits. No biliary dilatation. Subcentimeter hypodensity within the posterior aspect of the spleen noted, too small the characterize. Right adrenal gland unremarkable. There is a 9 mm hypodense lesion within the left adrenal gland, indeterminate.  No acute inflammatory changes seen about the pancreas. A somewhat  tubular cystic lesion measuring 16 x 19 x 11 mm present within the mid pancreatic body. This lesion is indeterminate, but may reflect a cystic pancreatic neoplasm such as it side branch IPMN. Pancreatic duct itself is of normal caliber.  Kidneys are equal in size with symmetric enhancement. No nephrolithiasis, hydronephrosis, or focal enhancing renal mass.  Stomach within normal limits. No evidence for bowel obstruction. Appendix is not definitely visualize, however, no inflammatory changes are seen about the cecum or within the right lower quadrant to suggest acute appendicitis.  Bladder within normal limits. E sure contraceptive device is present within the fallopian tubes bilaterally. Ovaries are normal.  Small volume free fluid present within the pelvic cul-de-sac, likely physiologic. No free air. No pathologically enlarged lymph nodes identified within the abdomen and pelvis. Normal intravascular enhancement seen throughout the intra-abdominal aorta and its branch  vessels.  No acute osseous abnormality. No worrisome lytic or blastic osseous lesion.  IMPRESSION: 1. No CT evidence for acute intra-abdominal pelvic process. 2. 16 x 19 x 11 mm cystic tubular lesion within the pancreatic body. Finding is indeterminate, but may reflect a cystic pancreatic neoplasm such as an IPMN. Follow-up examination with MRI is recommended for complete characterization. This can be performed on a nonemergent basis. 3. 9 mm hypodense left adrenal nodule, indeterminate, but statistically likely a small adenoma. This could also be assessed at follow-up MRI. 4. Small volume free fluid within the pelvis, likely physiologic.   Electronically Signed   By: Rise Mu M.D.   On: 09/17/2014 01:07    Workup here without intra-abdominal process.  Aside from a cystic tubular lesion within the pancreatic body.  Patient updated on findings and need for follow-up MRI not emergently.  GI pathogen panel is pending.  Patient will need to be contacted if she needs further treatment.  I do not feel strongly that she needs antibiotics had a normal CT scan and lab work.  Patient encouraged to use probiotics, and stick to a brat diet until feeling better  Olivia Mackie, MD 09/17/14 [phone]

## 2014-09-19 ENCOUNTER — Ambulatory Visit
Admission: RE | Admit: 2014-09-19 | Discharge: 2014-09-19 | Disposition: A | Discharge status: Home / Self Care | Payer: BC Managed Care – PPO | Source: Ambulatory Visit | Attending: Neurology | Admitting: Neurology

## 2014-09-19 DIAGNOSIS — H5500 Unspecified nystagmus: Secondary | ICD-10-CM

## 2014-09-19 DIAGNOSIS — R519 Headache, unspecified: Secondary | ICD-10-CM

## 2014-09-19 DIAGNOSIS — R51 Headache: Principal | ICD-10-CM

## 2014-09-19 LAB — GI PATHOGEN PANEL BY PCR, STOOL
C difficile toxin A/B: NOT DETECTED
CRYPTOSPORIDIUM BY PCR: NOT DETECTED
Campylobacter by PCR: NOT DETECTED
E COLI 0157 BY PCR: NOT DETECTED
E coli (ETEC) LT/ST: NOT DETECTED
E coli (STEC): NOT DETECTED
G lamblia by PCR: NOT DETECTED
Norovirus GI/GII: NOT DETECTED
Rotavirus A by PCR: NOT DETECTED
SALMONELLA BY PCR: NOT DETECTED

## 2014-10-18 ENCOUNTER — Emergency Department (INDEPENDENT_AMBULATORY_CARE_PROVIDER_SITE_OTHER)
Admission: EM | Admit: 2014-10-18 | Discharge: 2014-10-18 | Disposition: A | Discharge status: Home / Self Care | Payer: BC Managed Care – PPO | Source: Home / Self Care | Attending: Family Medicine | Admitting: Family Medicine

## 2014-10-18 ENCOUNTER — Emergency Department (INDEPENDENT_AMBULATORY_CARE_PROVIDER_SITE_OTHER): Payer: BC Managed Care – PPO

## 2014-10-18 DIAGNOSIS — S86892A Other injury of other muscle(s) and tendon(s) at lower leg level, left leg, initial encounter: Secondary | ICD-10-CM | POA: Diagnosis not present

## 2014-10-18 MED ORDER — MELOXICAM 15 MG PO TABS: 15.0000 mg | ORAL_TABLET | Freq: Every day | ORAL | Status: DC

## 2014-10-18 NOTE — ED Notes (Signed)
Patient reports left ankle pain onset 2 days ago. Patient reports she runs regularly. She has been training for a half marathon. She reports that she rested yesterday and stayed off her feet and still felt pain this morning.

## 2014-10-18 NOTE — Discharge Instructions (Signed)
Shin Splints Shin splints is a painful condition that is felt on the shinbone or in the muscles on either side of the bone (front of your lower leg). Shin splints happen when physical activities, such as sports or other demanding exercise, leads to inflammation of the muscles, tendons, and the thin layer that covers the shinbone.  CAUSES   Overuse of muscles.  Repetitive activities.  Flat feet or rigid arches. Activities that could contribute to shin splints include:  A sudden increase in exercise time.  Starting a new, demanding activity.  Running up hills or long distances.  Playing sports with sudden starts and stops.  A poor warm up.  Old or worn-out shoes. SYMPTOMS   Pain on the front of the leg.  Pain while exercising or at rest. DIAGNOSIS  Your caregiver will diagnose shin splints from a history of your symptoms and a physical exam. You may be observed as you walk or run. X-ray exams or further testing may be needed to rule out other problems, such as a stress fracture, which also causes lower leg pain. TREATMENT  Your caregiver may decide on the treatment based on your age, history, health, and how bad the pain is. Most cases of shin splints can be managed by one or more of the following:  Resting.  Reducing the length and intensity of your exercise.  Stopping the activity that causes shin pain.  Taking medicines to control the inflammation.  Icing, massaging, stretching, and strengthening the affected area.  Getting shoes with rigid heels, shock absorption, and a good arch support. HOME CARE INSTRUCTIONS   Resume activity steadily or as directed by your caregiver.  Restart your exercise sessions with non-weight-bearing exercises, such as cycling or swimming.  Stop running if the pain returns.  Warm up properly before exercising.  Run on a level and fairly firm surface.  Gradually change the intensity of an exercise.  Limit increases in running  distance by no more than 5 to 10% weekly. This means if you are running 5 miles, you can only increase your run by 1/2 a mile at a time.  Change your athletic shoes every 6 months, or every 350 to 450 miles. SEEK MEDICAL CARE IF:   Symptoms continue or worsen even after treatment.  The location, intensity, or type of pain changes over time. SEEK IMMEDIATE MEDICAL CARE IF:   You have severe pain.  You have trouble walking. MAKE SURE YOU:  Understand these instructions.  Will watch your condition.  Will get help right away if you are not doing well or get worse. Document Released: 07/29/2000 Document Revised: 10/24/2011 Document Reviewed: 01/16/2011 ExitCare Patient Information 2015 [employer]. This information is not intended to replace advice given to you by your health care provider. Make sure you discuss any questions you have with your health care provider.  

## 2014-10-18 NOTE — ED Provider Notes (Signed)
CSN: [id number]     Arrival date & time 10/18/14  1126 History   First MD Initiated Contact with Patient 10/18/14 1314     Chief Complaint  Patient presents with  . Ankle Pain   (Consider location/radiation/quality/duration/timing/severity/associated sxs/prior Treatment) HPI       39 year old female who is training for half marathon presents complaining of left shin pain. This started a few days ago. No swelling or numbness. No specific injury. Pain has gradually worsened, she is concerned because it did not get any better yesterday evening that she stay off her feet all day. She has taken ibuprofen a few times for the pain. She is up to running 9 miles per day on her running days. She has never had this before  Past Medical History  Diagnosis Date  . Reflux   . Asthma   . Albinism   . Nystagmus   . Depression   . Anxiety    Past Surgical History  Procedure Laterality Date  . Tooth extraction    . Upper gastrointestinal endoscopy    . Eye surgery     Family History  Problem Relation Age of Onset  . Hypertension Father   . Cancer Father     lung  . Heart disease Maternal Grandmother   . Cancer Maternal Grandfather     lung  . Migraines Mother   . Migraines Son    History  Substance Use Topics  . Smoking status: Never Smoker   . Smokeless tobacco: Never Used  . Alcohol Use: Yes     Comment: occasional   OB History    Gravida Para Term Preterm AB TAB SAB Ectopic Multiple Living   0 0 0 0 0 0 3     Review of Systems  Musculoskeletal:       Left shin pain, see history of present illness  All other systems reviewed and are negative.   Allergies  Doxycycline; Aspirin; and Ibuprofen  Home Medications   Prior to Admission medications   Medication Sig Start Date End Date Taking? Authorizing Provider  amoxicillin-clavulanate (AUGMENTIN) 875-125 MG per tablet Take 1 tablet by mouth 2 (two) times daily. 08/21/14   Ozella Rocks, MD  b complex vitamins tablet Take  1 tablet by mouth daily.    Historical Provider, MD  dicyclomine (BENTYL) 20 MG tablet Take 1 tablet (20 mg total) by mouth every 6 (six) hours as needed for spasms (for abdominal cramping). 09/17/14   Olivia Mackie, MD  fexofenadine (ALLEGRA) 180 MG tablet Take 180 mg by mouth daily.    Historical Provider, MD  Ibuprofen (ADVIL MIGRAINE) 200 MG CAPS Take 800 mg by mouth.    Historical Provider, MD  meloxicam (MOBIC) 15 MG tablet Take 1 tablet (15 mg total) by mouth daily. 10/18/14   Graylon Good, PA-C  neomycin-polymyxin-hydrocortisone (CORTISPORIN) 3.5-10000-1 otic suspension Place 4 drops into the left ear 3 (three) times daily. X 7 days 08/21/14   Ozella Rocks, MD  Norethindrone (HEATHER PO) Take by mouth.    Historical Provider, MD  Probiotic Product ([employer] PROBIOTIC MAXIMUM STRENGTH) CAPS Use as directed on package 09/17/14   Olivia Mackie, MD  SUMAtriptan Succinate (IMITREX PO) Take by mouth.    Historical Provider, MD  topiramate (TOPAMAX) 50 MG tablet Take 50 mg by mouth 2 (two) times daily.    Historical Provider, MD   BP 104/58 mmHg  Pulse 46  Temp(Src) 97.7 F (36.5 C) (  Oral)  Resp 20  SpO2 100% Physical Exam  Constitutional: She is oriented to person, place, and time. Vital signs are normal. She appears well-developed and well-nourished. No distress.  HENT:  Head: Normocephalic and atraumatic.  Pulmonary/Chest: Effort normal. No respiratory distress.  Musculoskeletal:       Left lower leg: She exhibits tenderness (there is tenderness over the anterior lateral portion of the shin, diffusely over the distal tibia).  Neurological: She is alert and oriented to person, place, and time. She has normal strength. Coordination normal.  Skin: Skin is warm and dry. No rash noted. She is not diaphoretic.  Psychiatric: She has a normal mood and affect. Judgment normal.  Nursing note and vitals reviewed.   ED Course  Procedures (including critical care time) Labs Review Labs Reviewed - No  data to display  Imaging Review Dg Tibia/fibula Left  10/18/2014   CLINICAL DATA:  Lower left leg pain for 3 days.  EXAM: LEFT TIBIA AND FIBULA - 2 VIEW  COMPARISON:  None.  FINDINGS: There is no evidence of fracture or other focal bone lesions. Soft tissues are unremarkable.  IMPRESSION: Negative.   Electronically Signed   By: Harmon PierJeffrey  Hu M.D.   On: 10/18/2014 14:57    MDM   1. Shin splints, left, initial encounter    Consistent with shin splints. X-rays normal. Treat symptomatically with meloxicam and rest, follow-up with sports medicine center if no improvement in a week    Graylon GoodZachary H Robbie Nangle, PA-C 10/18/14 1502

## 2014-11-04 ENCOUNTER — Encounter: Payer: Self-pay | Admitting: Family

## 2014-11-04 ENCOUNTER — Ambulatory Visit (INDEPENDENT_AMBULATORY_CARE_PROVIDER_SITE_OTHER): Payer: BC Managed Care – PPO | Admitting: Family

## 2014-11-04 VITALS — BP 110/84 | HR 46 | Temp 97.5°F | Resp 18 | Wt 158.3 lb

## 2014-11-04 DIAGNOSIS — K449 Diaphragmatic hernia without obstruction or gangrene: Secondary | ICD-10-CM | POA: Diagnosis not present

## 2014-11-04 DIAGNOSIS — A039 Shigellosis, unspecified: Secondary | ICD-10-CM | POA: Diagnosis not present

## 2014-11-04 DIAGNOSIS — K869 Disease of pancreas, unspecified: Secondary | ICD-10-CM

## 2014-11-04 MED ORDER — ALPRAZOLAM 0.25 MG PO TABS: 0.2500 mg | ORAL_TABLET | Freq: Two times a day (BID) | ORAL | Status: DC | PRN

## 2014-11-04 NOTE — Assessment & Plan Note (Signed)
Stable with current over-the-counter medication management. Continue current regimen and follow up if symptoms worsen or fail to improve

## 2014-11-04 NOTE — Progress Notes (Signed)
Subjective:    Patient ID: Kimberly Fernandez, female    DOB: 06-Nov-1975, 39 y.o.   MRN: [id number]  Chief Complaint  Patient presents with  . Establish Care    Was told to get an MRI bc she has a cyst on her pancreas, was diagnosed with shigella in February    HPI:  Kimberly Fernandez is a 39 y.o. female who presents today to establish care and discuss Shigella and cyst on her pancreas.  1) Shigella infection - She was previously diagnosed with shigella infection following a visit to Urgent Care with stomach cramps and diarrhea. No antibiotic treatment was given and the associated symptoms of stomach cramps and diarrhea have both resolved. 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  2) Cyst on pancreas - Associated symptom of a cyst located on her pancreas was found during a CT scan of her abdomen during her Shigella infection. CT scan showed the following:   IMPRESSION:  1. No CT evidence for acute intra-abdominal pelvic process.  2. 16 x 19 x 11 mm cystic tubular lesion within the pancreatic body.  Finding is indeterminate, but may reflect a cystic pancreatic  neoplasm such as an IPMN. Follow-up examination with MRI is  recommended for complete characterization. This can be performed on  a nonemergent basis.  3. 9 mm hypodense left adrenal nodule, indeterminate, but  statistically likely a small adenoma. This could also be assessed at  follow-up MRI.  4. Small volume free fluid within the pelvis, likely physiologic.  Denies any associated symptoms. Denies abdominal pain. No modifying factors have been done to this point.  3) Hiatal Hernia - Currently stable with OTC meds as needed.    Allergies  Allergen Reactions  . Doxycycline Rash    Current Outpatient Prescriptions on File Prior to Visit  Medication Sig Dispense Refill  . fexofenadine (ALLEGRA) 180 MG tablet Take 180 mg by mouth daily.    . Ibuprofen (ADVIL MIGRAINE) 200 MG CAPS Take 800 mg by mouth.    .  Norethindrone (HEATHER PO) Take by mouth.    . Probiotic Product ([employer]S PROBIOTIC MAXIMUM STRENGTH) CAPS Use as directed on package 30 capsule 0  . SUMAtriptan Succinate (IMITREX PO) Take by mouth.    . topiramate (TOPAMAX) 50 MG tablet Take 50 mg by mouth 2 (two) times daily.    . [DISCONTINUED] diphenhydramine-acetaminophen (TYLENOL PM) 25-500 MG TABS Take 1 tablet by mouth at bedtime as needed. Patient used medication for sleep.      No current facility-administered medications on file prior to visit.    Past Medical History  Diagnosis Date  . Reflux   . Asthma   . Albinism   . Nystagmus   . Depression   . Anxiety   . GERD (gastroesophageal reflux disease)   . Migraines     Past Surgical History  Procedure Laterality Date  . Tooth extraction    . Upper gastrointestinal endoscopy    . Eye surgery      Family History  Problem Relation Age of Onset  . Hypertension Father   . Cancer Father     lung  . Heart disease Maternal Grandmother   . Cancer Maternal Grandfather     lung  . Migraines Mother   . Migraines Son     History   Social History  . Marital Status: Married    Spouse Name: N/A  . Number of Children: 3  . Years of Education:  18   Occupational History  . Lab Manager    Social History Main Topics  . Smoking status: Never Smoker   . Smokeless tobacco: Never Used  . Alcohol Use: Yes     Comment: occasional  . Drug Use: No  . Sexual Activity: Yes    Birth Control/ Protection: None     Comment: NFP   Other Topics Concern  . Not on file   Social History Narrative   [place], [place]. Fun: run, crochet   Denies religious beliefs that would effect health care.     Review of Systems  Constitutional: Negative for fever and chills.  Gastrointestinal: Positive for constipation. Negative for nausea, vomiting, abdominal pain, diarrhea and abdominal distention.  Endocrine: Negative for cold intolerance, heat intolerance, polydipsia,  polyphagia and polyuria.      Objective:    BP 110/84 mmHg  Pulse 46  Temp(Src) 97.5 F (36.4 C) (Oral)  Resp 18  Wt 158 lb 4.8 oz (71.804 kg)  SpO2 98% Nursing note and vital signs reviewed.  Physical Exam  Constitutional: She is oriented to person, place, and time. She appears well-developed and well-nourished. No distress.  Cardiovascular: Normal rate, regular rhythm, normal heart sounds and intact distal pulses.   Pulmonary/Chest: Effort normal and breath sounds normal.  Abdominal: Normal appearance and bowel sounds are normal. She exhibits no abdominal bruit, no ascites and no mass. There is no hepatosplenomegaly. There is tenderness in the epigastric area, periumbilical area and left upper quadrant. There is no rigidity, no rebound, no guarding, no tenderness at McBurney's point and negative Murphy's sign.  Neurological: She is alert and oriented to person, place, and time.  Skin: Skin is warm and dry.  Psychiatric: She has a normal mood and affect. Her behavior is normal. Judgment and thought content normal.       Assessment & Plan:

## 2014-11-04 NOTE — Assessment & Plan Note (Signed)
16 x 19 x 11 mm cystic tubular lesion within the pancreatic body noted on recent CT scan. Mild abdominal tenderness today without any other symptoms. Obtain MRI abdomen with/without contrast for further evaluation. Pending results will consider referral to General surgery if needed. Follow up if symptoms worsen pending MRI.

## 2014-11-04 NOTE — Assessment & Plan Note (Signed)
Patient with recent Shigella infection when seen in urgent care. Symptoms of abdominal cramping and diarrhea have resolved. Follow-up if symptoms return.

## 2014-11-04 NOTE — Patient Instructions (Signed)
Thank you for choosing [facility].  Summary/Instructions:  Please schedule a time for your physical.  The office will be in touch with your regarding scheduling of your MRI.  Your prescription(s) have been submitted to your pharmacy or been printed and provided for you. Please take as directed and contact our office if you believe you are having problem(s) with the medication(s) or have any questions.  If your symptoms worsen or fail to improve, please contact our office for further instruction, or in case of emergency go directly to the emergency room at the closest medical facility.

## 2014-11-04 NOTE — Progress Notes (Signed)
Pre visit review using our clinic review tool, if applicable. No additional management support is needed unless otherwise documented below in the visit note. 

## 2014-11-18 ENCOUNTER — Telehealth: Payer: Self-pay | Admitting: Family

## 2014-11-18 NOTE — Telephone Encounter (Signed)
Can you please check on status of MRI

## 2014-11-27 ENCOUNTER — Ambulatory Visit (INDEPENDENT_AMBULATORY_CARE_PROVIDER_SITE_OTHER): Payer: BC Managed Care – PPO | Admitting: Sports Medicine

## 2014-11-27 ENCOUNTER — Encounter: Payer: Self-pay | Admitting: Sports Medicine

## 2014-11-27 VITALS — BP 115/73 | HR 48 | Ht 64.0 in | Wt 154.0 lb

## 2014-11-27 DIAGNOSIS — S86112A Strain of other muscle(s) and tendon(s) of posterior muscle group at lower leg level, left leg, initial encounter: Secondary | ICD-10-CM | POA: Diagnosis not present

## 2014-11-27 NOTE — Patient Instructions (Signed)
  Side Lying exercises start with 5 and work up to 15 3 times per day    Try using Arnica Gel or Asper-cream topically daily Icing after exercise fot 15 minutes is helpful

## 2014-11-29 NOTE — Progress Notes (Signed)
  Kimberly Fernandez - 39 y.o. female MRN [id number]  Date of birth: 05/03/1976  SUBJECTIVE: CC: 1. Left leg pain, initial eval     HPI:  3-4 weeks of worsening left mid tibial medial   Worse with running up and down hill.  Has been training for a marathon.  No prior stress fractures or stress reactions.  No nighttime pain, fevers, chills, recently gained her weight loss.  No recent change in running shoes or surface.  She has been increasing her mileage.  No prior symptoms similar to this     ROS:  Per hpi   HISTORY:  Past Medical, Surgical, Social, and Family History reviewed & updated per EMR.  Pertinent Historical Findings include: Social History   Occupational History  . Lab Manager    Social History Main Topics  . Smoking status: Never Smoker   . Smokeless tobacco: Never Used  . Alcohol Use: Yes     Comment: occasional  . Drug Use: No  . Sexual Activity: Yes    Birth Control/ Protection: None     Comment: NFP    No specialty comments available. Problem  Gastrocnemius Strain, Left    OBJECTIVE:  VS:   HT:5\' 4"  (162.6 cm)   WT:154 lb (69.854 kg)  BMI:26.5          BP:115/73 mmHg  HR:(!) 48bpm  TEMP: ( )  RESP:   PHYSICAL EXAM: GENERAL: adult Caucasian. No acute distress PSYCH: Alert and appropriately interactive. SKIN: No open skin lesions or abnormal skin markings on areas inspected as below VASCULAR: bilateral DP and PT pulses 2+/4.  No significant pretibial edema. NEURO: Lower extremity strength is 5+/5 in all myotomes; sensation is intact to light touch in all dermatomes. LEFT LEG EXAM: overall well line, no significant deformity.  No significant bruising or ecchymosis.  Symmetric to write.  Marked tenderness to palpation over medial tibia without focality most notably over the posterior aspect at the medial border of the gastroc/soleus.  Full ankle range of motion and normal strength with dorsiflexion, plantar flexion, inversion and E version.  No pain  with FHL resistance   DATA OBTAINED:   Limited MSK Ultrasound of LEFT LEG: Findings: No bony/cortical irregularities or periosteal thickening.  There is disruption of medial gastroc fibers at the far medial border of the medial head of the gastroc at the musculotendinous junction.  Impression: The above findings are consistent with partial medial head of gastro strain     ASSESSMENT & PLAN: See problem based charting & AVS for additional documentation Problem List Items Addressed This Visit    Gastrocnemius strain, left - Primary    Medial head of gastroc strain Body Helix left calf compression sleeve provided today. Discussed using during activity as well as for the first 2 hours following activity and PRN. HEP: Alfredson exercises, icing, relative rest.  Minimizing Hill running        FOLLOW UP:  Return in about 6 weeks (around 01/08/2015).

## 2014-12-01 DIAGNOSIS — S86899A Other injury of other muscle(s) and tendon(s) at lower leg level, unspecified leg, initial encounter: Secondary | ICD-10-CM | POA: Insufficient documentation

## 2014-12-01 NOTE — Assessment & Plan Note (Signed)
Medial head of gastroc strain Body Helix left calf compression sleeve provided today. Discussed using during activity as well as for the first 2 hours following activity and PRN. HEP: Alfredson exercises, icing, relative rest.  Minimizing Hill running

## 2014-12-18 ENCOUNTER — Emergency Department (INDEPENDENT_AMBULATORY_CARE_PROVIDER_SITE_OTHER)
Admission: EM | Admit: 2014-12-18 | Discharge: 2014-12-18 | Disposition: A | Discharge status: Home / Self Care | Payer: BC Managed Care – PPO | Source: Home / Self Care | Attending: Family Medicine | Admitting: Family Medicine

## 2014-12-18 ENCOUNTER — Encounter (HOSPITAL_COMMUNITY): Payer: Self-pay | Admitting: Family Medicine

## 2014-12-18 DIAGNOSIS — G43009 Migraine without aura, not intractable, without status migrainosus: Secondary | ICD-10-CM

## 2014-12-18 MED ORDER — ONDANSETRON HCL 4 MG PO TABS: 4.0000 mg | ORAL_TABLET | Freq: Three times a day (TID) | ORAL | Status: DC | PRN

## 2014-12-18 MED ORDER — SUMATRIPTAN SUCCINATE 6 MG/0.5ML ~~LOC~~ SOLN
SUBCUTANEOUS | Status: AC
Start: 2014-12-18 — End: 2014-12-18
  Filled 2014-12-18: qty 0.5

## 2014-12-18 MED ORDER — SUMATRIPTAN SUCCINATE 6 MG/0.5ML ~~LOC~~ SOLN
6.0000 mg | Freq: Once | SUBCUTANEOUS | Status: AC
  Administered 2014-12-18: 6 mg via SUBCUTANEOUS

## 2014-12-18 MED ORDER — ACETAMINOPHEN 325 MG PO TABS
975.0000 mg | ORAL_TABLET | Freq: Once | ORAL | Status: AC
  Administered 2014-12-18: 975 mg via ORAL

## 2014-12-18 MED ORDER — ONDANSETRON 4 MG PO TBDP
4.0000 mg | ORAL_TABLET | Freq: Once | ORAL | Status: AC
  Administered 2014-12-18: 4 mg via ORAL

## 2014-12-18 MED ORDER — ACETAMINOPHEN 325 MG PO TABS
ORAL_TABLET | ORAL | Status: AC
  Filled 2014-12-18: qty 3

## 2014-12-18 MED ORDER — ACETAMINOPHEN 500 MG PO TABS: 1000.0000 mg | ORAL_TABLET | Freq: Once | ORAL | Status: DC

## 2014-12-18 MED ORDER — DEXAMETHASONE SODIUM PHOSPHATE 10 MG/ML IJ SOLN
10.0000 mg | Freq: Once | INTRAMUSCULAR | Status: AC
  Administered 2014-12-18: 10 mg via INTRAMUSCULAR

## 2014-12-18 MED ORDER — ONDANSETRON 4 MG PO TBDP
ORAL_TABLET | ORAL | Status: AC
  Filled 2014-12-18: qty 1

## 2014-12-18 MED ORDER — DEXAMETHASONE SODIUM PHOSPHATE 10 MG/ML IJ SOLN
INTRAMUSCULAR | Status: AC
  Filled 2014-12-18: qty 1

## 2014-12-18 NOTE — Discharge Instructions (Signed)
You were given imitrex, decadron, tylenol, and zofran for your migraine. Please take an additional 25mg -50mg  of benadryl tonight if needed If your symptoms worsen or do not improve please go to the emergency room.

## 2014-12-18 NOTE — ED Provider Notes (Signed)
CSN: [id number]     Arrival date & time 12/18/14  1417 History   First MD Initiated Contact with Patient 12/18/14 1436     No chief complaint on file.  (Consider location/radiation/quality/duration/timing/severity/associated sxs/prior Treatment) HPI  HA started 30hrs ago. Getting worse. Advil migraine, tylenol, imitrex, benedryl w/ minimal improvement. PHoptophobia, phonophobia. This is similar to previous migraines. Associated w/ nausea. Throbbing. L side of head.  Denies CP SOB, Palpitations, fevers, nuchal rigidity, syncope, vision change, mental change, rash  Past Medical History  Diagnosis Date  . Reflux   . Asthma   . Albinism   . Nystagmus   . Depression   . Anxiety   . GERD (gastroesophageal reflux disease)   . Migraines    Past Surgical History  Procedure Laterality Date  . Tooth extraction    . Upper gastrointestinal endoscopy    . Eye surgery     Family History  Problem Relation Age of Onset  . Hypertension Father   . Cancer Father     lung  . Heart disease Maternal Grandmother   . Cancer Maternal Grandfather     lung  . Migraines Mother   . Migraines Son    History  Substance Use Topics  . Smoking status: Never Smoker   . Smokeless tobacco: Never Used  . Alcohol Use: Yes     Comment: occasional   OB History    Gravida Para Term Preterm AB TAB SAB Ectopic Multiple Living   3 3 3  0 0 0 0 0 0 3     Review of Systems Per HPI with all other pertinent systems negative.   Allergies  Doxycycline  Home Medications   Prior to Admission medications   Medication Sig Start Date End Date Taking? Authorizing Provider  ALPRAZolam (XANAX) 0.25 MG tablet Take 1 tablet (0.25 mg total) by mouth 2 (two) times daily as needed for anxiety. 11/04/14   Veryl SpeakGregory D Calone, FNP  Ascorbic Acid (VITAMIN C) 1000 MG tablet Take 1,000 mg by mouth daily.    Historical Provider, MD  fexofenadine (ALLEGRA) 180 MG tablet Take 180 mg by mouth daily.    Historical Provider, MD   Ibuprofen (ADVIL MIGRAINE) 200 MG CAPS Take 800 mg by mouth.    Historical Provider, MD  Norethindrone (HEATHER PO) Take by mouth.    Historical Provider, MD  ondansetron (ZOFRAN) 4 MG tablet Take 1 tablet (4 mg total) by mouth every 8 (eight) hours as needed for nausea or vomiting. 12/18/14   Ozella Rocksavid J Merrell, MD  Probiotic Product ([employer] PROBIOTIC MAXIMUM STRENGTH) CAPS Use as directed on package 09/17/14   Marisa Severinlga Otter, MD  SUMAtriptan Succinate (IMITREX PO) Take by mouth.    Historical Provider, MD  topiramate (TOPAMAX) 50 MG tablet Take 50 mg by mouth 2 (two) times daily.    Historical Provider, MD   BP 127/68 mmHg  Pulse 53  Temp(Src) 97.8 F (36.6 C) (Oral)  Resp 16  SpO2 100% Physical Exam Physical Exam  Constitutional: oriented to person, place, and time. appears well-developed and well-nourished. No distress.  HENT:  Head: Normocephalic and atraumatic.  Eyes: EOMI. PERRL.  Neck: Normal range of motion.  Cardiovascular: RRR, no m/r/g, 2+ distal pulses,  Pulmonary/Chest: Effort normal and breath sounds normal. No respiratory distress.  Abdominal: Soft. Bowel sounds are normal. NonTTP, no distension.  Musculoskeletal: Normal range of motion. Non ttp, no effusion.  Neurological: alert and oriented to person, place, and time. , CN 2-12 grossly intact. Moves  all extremities incorporated fashion Skin: Skin is warm. No rash noted. non diaphoretic.  Psychiatric: normal mood and affect. behavior is normal. Judgment and thought content normal.   ED Course  Procedures (including critical care time) Labs Review Labs Reviewed - No data to display  Imaging Review No results found.   MDM   1. Nonintractable migraine, unspecified migraine type   classic migraine symptoms for this patient, no neurological deficits. Imitrex 0.6 mg [place], Decadron 10 mg IM, Zofran 4 mg by mouth, Tylenol 1 g given Continue Zofran at home when necessary, continue Benadryl. Patient go to emergency room if symptoms  worsen or do not improve.    Ozella Rocksavid J Merrell, MD 12/18/14 [phone]

## 2014-12-18 NOTE — ED Notes (Signed)
Pt states that she has had a migraine since 12/17/2014 0930.

## 2014-12-19 ENCOUNTER — Encounter (HOSPITAL_COMMUNITY): Payer: Self-pay | Admitting: Emergency Medicine

## 2014-12-19 ENCOUNTER — Emergency Department (HOSPITAL_COMMUNITY)
Admission: EM | Admit: 2014-12-19 | Discharge: 2014-12-19 | Disposition: A | Discharge status: Home / Self Care | Payer: BC Managed Care – PPO | Attending: Emergency Medicine | Admitting: Emergency Medicine

## 2014-12-19 DIAGNOSIS — R231 Pallor: Secondary | ICD-10-CM | POA: Insufficient documentation

## 2014-12-19 DIAGNOSIS — G43011 Migraine without aura, intractable, with status migrainosus: Secondary | ICD-10-CM | POA: Diagnosis not present

## 2014-12-19 DIAGNOSIS — F329 Major depressive disorder, single episode, unspecified: Secondary | ICD-10-CM | POA: Insufficient documentation

## 2014-12-19 DIAGNOSIS — Z79899 Other long term (current) drug therapy: Secondary | ICD-10-CM | POA: Insufficient documentation

## 2014-12-19 DIAGNOSIS — G43001 Migraine without aura, not intractable, with status migrainosus: Secondary | ICD-10-CM

## 2014-12-19 DIAGNOSIS — G43909 Migraine, unspecified, not intractable, without status migrainosus: Secondary | ICD-10-CM | POA: Diagnosis present

## 2014-12-19 DIAGNOSIS — Z8639 Personal history of other endocrine, nutritional and metabolic disease: Secondary | ICD-10-CM | POA: Insufficient documentation

## 2014-12-19 DIAGNOSIS — Z8719 Personal history of other diseases of the digestive system: Secondary | ICD-10-CM | POA: Diagnosis not present

## 2014-12-19 DIAGNOSIS — J45909 Unspecified asthma, uncomplicated: Secondary | ICD-10-CM | POA: Insufficient documentation

## 2014-12-19 DIAGNOSIS — Z793 Long term (current) use of hormonal contraceptives: Secondary | ICD-10-CM | POA: Diagnosis not present

## 2014-12-19 DIAGNOSIS — G43911 Migraine, unspecified, intractable, with status migrainosus: Secondary | ICD-10-CM

## 2014-12-19 DIAGNOSIS — H5500 Unspecified nystagmus: Secondary | ICD-10-CM | POA: Insufficient documentation

## 2014-12-19 DIAGNOSIS — F419 Anxiety disorder, unspecified: Secondary | ICD-10-CM | POA: Insufficient documentation

## 2014-12-19 MED ORDER — KETOROLAC TROMETHAMINE 30 MG/ML IJ SOLN
30.0000 mg | Freq: Once | INTRAMUSCULAR | Status: AC
  Administered 2014-12-19: 30 mg via INTRAVENOUS
  Filled 2014-12-19: qty 1

## 2014-12-19 MED ORDER — ONDANSETRON HCL 4 MG/2ML IJ SOLN
4.0000 mg | Freq: Once | INTRAMUSCULAR | Status: AC
  Administered 2014-12-19: 4 mg via INTRAVENOUS
  Filled 2014-12-19: qty 2

## 2014-12-19 MED ORDER — VALPROATE SODIUM 500 MG/5ML IV SOLN
500.0000 mg | Freq: Once | INTRAVENOUS | Status: AC
  Administered 2014-12-19: 500 mg via INTRAVENOUS
  Filled 2014-12-19 (×2): qty 5

## 2014-12-19 MED ORDER — SODIUM CHLORIDE 0.9 % IV BOLUS (SEPSIS)
500.0000 mL | Freq: Once | INTRAVENOUS | Status: AC
  Administered 2014-12-19: 500 mL via INTRAVENOUS

## 2014-12-19 MED ORDER — VALPROATE SODIUM 500 MG/5ML IV SOLN
500.0000 mg | Freq: Once | INTRAVENOUS | Status: AC
  Administered 2014-12-19: 500 mg via INTRAVENOUS
  Filled 2014-12-19: qty 5

## 2014-12-19 MED ORDER — DIPHENHYDRAMINE HCL 50 MG/ML IJ SOLN
25.0000 mg | Freq: Once | INTRAMUSCULAR | Status: AC
  Administered 2014-12-19: 25 mg via INTRAVENOUS
  Filled 2014-12-19: qty 1

## 2014-12-19 MED ORDER — PROMETHAZINE HCL 25 MG/ML IJ SOLN
25.0000 mg | Freq: Once | INTRAMUSCULAR | Status: AC
  Administered 2014-12-19: 25 mg via INTRAVENOUS
  Filled 2014-12-19: qty 1

## 2014-12-19 MED ORDER — FENTANYL CITRATE (PF) 100 MCG/2ML IJ SOLN
50.0000 ug | Freq: Once | INTRAMUSCULAR | Status: AC
  Administered 2014-12-19: 50 ug via INTRAVENOUS
  Filled 2014-12-19: qty 2

## 2014-12-19 MED ORDER — DEXAMETHASONE SODIUM PHOSPHATE 10 MG/ML IJ SOLN
10.0000 mg | Freq: Once | INTRAMUSCULAR | Status: AC
  Administered 2014-12-19: 10 mg via INTRAVENOUS
  Filled 2014-12-19: qty 1

## 2014-12-19 NOTE — ED Provider Notes (Signed)
CSN: [id number]     Arrival date & time 12/19/14  0826 History   First MD Initiated Contact with Patient 12/19/14 [phone]     Chief Complaint  Patient presents with  . Migraine     (Consider location/radiation/quality/duration/timing/severity/associated sxs/prior Treatment) Patient is a 39 y.o. female presenting with migraines.  Migraine Associated symptoms include headaches. Pertinent negatives include no chest pain, no abdominal pain and no shortness of breath.   patient with a headache. Was seen at urgent care yesterday and felt somewhat better after treatment for the headache there. States it returned after and is more severe. States it will normally not be this severe. She does have some photophobia and nausea. No fevers. She does have some slight neck stiffness which is normal for her. States she gets migraines around 3 days a week. She is on Topamax with little relief.  Past Medical History  Diagnosis Date  . Reflux   . Asthma   . Albinism   . Nystagmus   . Depression   . Anxiety   . GERD (gastroesophageal reflux disease)   . Migraines    Past Surgical History  Procedure Laterality Date  . Tooth extraction    . Upper gastrointestinal endoscopy    . Eye surgery     Family History  Problem Relation Age of Onset  . Hypertension Father   . Cancer Father     lung  . Heart disease Maternal Grandmother   . Cancer Maternal Grandfather     lung  . Migraines Mother   . Migraines Son    History  Substance Use Topics  . Smoking status: Never Smoker   . Smokeless tobacco: Never Used  . Alcohol Use: Yes     Comment: occasional   OB History    Gravida Para Term Preterm AB TAB SAB Ectopic Multiple Living   3 3 3  0 0 0 0 0 0 3     Review of Systems  Constitutional: Negative for activity change and appetite change.  Eyes: Positive for photophobia and visual disturbance. Negative for pain.  Respiratory: Negative for chest tightness and shortness of breath.   Cardiovascular:  Negative for chest pain and leg swelling.  Gastrointestinal: Positive for nausea. Negative for vomiting, abdominal pain and diarrhea.  Genitourinary: Negative for flank pain.  Musculoskeletal: Negative for back pain and neck stiffness.  Skin: Positive for pallor. Negative for rash.  Neurological: Positive for headaches. Negative for weakness and numbness.  Psychiatric/Behavioral: Negative for behavioral problems.      Allergies  Doxycycline  Home Medications   Prior to Admission medications   Medication Sig Start Date End Date Taking? Authorizing Provider  ALPRAZolam (XANAX) 0.25 MG tablet Take 1 tablet (0.25 mg total) by mouth 2 (two) times daily as needed for anxiety. Patient taking differently: Take 0.25 mg by mouth daily as needed for anxiety (diagnostic testing).  11/04/14  Yes Veryl SpeakGregory D Calone, FNP  Ascorbic Acid (VITAMIN C) 1000 MG tablet Take 1,000 mg by mouth daily.   Yes Historical Provider, MD  diphenhydrAMINE (SOMINEX) 25 MG tablet Take 25 mg by mouth at bedtime as needed for sleep (migraines).   Yes Historical Provider, MD  Ibuprofen (ADVIL MIGRAINE) 200 MG CAPS Take 200 mg by mouth every 6 (six) hours as needed (migraines).    Yes Historical Provider, MD  norethindrone (MICRONOR,CAMILA,ERRIN) 0.35 MG tablet Take 1 tablet by mouth daily.   Yes Historical Provider, MD  ondansetron (ZOFRAN) 4 MG tablet Take 1 tablet (4  mg total) by mouth every 8 (eight) hours as needed for nausea or vomiting. 12/18/14  Yes Ozella Rocksavid J Merrell, MD  Probiotic Product ([employer] PROBIOTIC MAXIMUM STRENGTH) CAPS Use as directed on package Patient taking differently: Take 1 tablet by mouth daily as needed (stomach).  09/17/14  Yes Marisa Severinlga Otter, MD  SUMAtriptan (IMITREX) 100 MG tablet Take 100 mg by mouth every 2 (two) hours as needed for migraine. May repeat in 2 hours if headache persists or recurs.   Yes Historical Provider, MD  topiramate (TOPAMAX) 25 MG tablet Take 75 mg by mouth 2 (two) times daily.   Yes  Historical Provider, MD   BP 102/67 mmHg  Pulse 60  Temp(Src) 98.7 F (37.1 C)  Resp 21  SpO2 100%  LMP 12/09/2014 Physical Exam  Constitutional: She is oriented to person, place, and time. She appears well-developed and well-nourished.  HENT:  Head: Normocephalic and atraumatic.  Eyes: EOM are normal. Pupils are equal, round, and reactive to light.  Nystagmus  Neck: Normal range of motion. Neck supple.  Cardiovascular: Normal rate, regular rhythm and normal heart sounds.   No murmur heard. Pulmonary/Chest: Effort normal and breath sounds normal.  Abdominal: Soft.  Musculoskeletal: Normal range of motion.  Neurological: She is alert and oriented to person, place, and time. No cranial nerve deficit.  Skin: Skin is warm and dry. There is pallor.  Patient is an Environmental health practitioneralbino  Psychiatric: She has a normal mood and affect. Her speech is normal.  Nursing note and vitals reviewed.   ED Course  Procedures (including critical care time) Labs Review Labs Reviewed - No data to display  Imaging Review No results found.   EKG Interpretation None      MDM   Final diagnoses:  Intractable migraine with status migrainosus, unspecified migraine type    Patient migraine headache. History of frequent headaches. States this is more severe than the one she has had before. Seen in urgent care yesterday and had steroids and Imitrex. Continued pain. Pain had improved somewhat but returned after treatment with Toradol and Compazine and Benadryl. Then given fentanyl and Depakote with minimal improvement. To be seen by neurology.    Benjiman CoreNathan Mallary Kreger, MD 12/19/14 [phone]

## 2014-12-19 NOTE — ED Notes (Signed)
Pt denies relief of migraine with medications given 45 minutes ago.

## 2014-12-19 NOTE — Consult Note (Signed)
Reason for Consult:Migraine Referring Physician: Jodi MourningZavitz  CC: Migraine  HPI: Kimberly Fernandez is an 39 y.o. female with a history of migraine who reports having migraines for at least 2 years.  The migraines can be located in any portion of her head but today is across the front.  There is associated nausea, vomiting and photophobia. At times there is also associated tinnitus and confusion.  Her migraines are at a frequency of 2-3 per week.  This particular headache started on Wednesday.  They can last 2-3 days.  She was treated earlier in the week but her headache returned.  She currently rates her headache at a 1/10 but reports if she gets up and moves around it will increase to a 5/10.  Has received Decadron, Benadryl, Fentanyl, Zofran, Phenergan and Depacon today.    Past Medical History  Diagnosis Date  . Reflux   . Asthma   . Albinism   . Nystagmus   . Depression   . Anxiety   . GERD (gastroesophageal reflux disease)   . Migraines     Past Surgical History  Procedure Laterality Date  . Tooth extraction    . Upper gastrointestinal endoscopy    . Eye surgery      Family History  Problem Relation Age of Onset  . Hypertension Father   . Cancer Father     lung  . Heart disease Maternal Grandmother   . Cancer Maternal Grandfather     lung  . Migraines Mother   . Migraines Son     Social History:  reports that she has never smoked. She has never used smokeless tobacco. She reports that she drinks alcohol. She reports that she does not use illicit drugs.  Allergies  Allergen Reactions  . Doxycycline Rash    Medications: I have reviewed the patient's current medications. Prior to Admission:  Current outpatient prescriptions:  .  ALPRAZolam (XANAX) 0.25 MG tablet, Take 1 tablet (0.25 mg total) by mouth 2 (two) times daily as needed for anxiety. (Patient taking differently: Take 0.25 mg by mouth daily as needed for anxiety (diagnostic testing). ), Disp: 4 tablet, Rfl: 0 .   Ascorbic Acid (VITAMIN C) 1000 MG tablet, Take 1,000 mg by mouth daily., Disp: , Rfl:  .  diphenhydrAMINE (SOMINEX) 25 MG tablet, Take 25 mg by mouth at bedtime as needed for sleep (migraines)., Disp: , Rfl:  .  Ibuprofen (ADVIL MIGRAINE) 200 MG CAPS, Take 200 mg by mouth every 6 (six) hours as needed (migraines). , Disp: , Rfl:  .  norethindrone (MICRONOR,CAMILA,ERRIN) 0.35 MG tablet, Take 1 tablet by mouth daily., Disp: , Rfl:  .  ondansetron (ZOFRAN) 4 MG tablet, Take 1 tablet (4 mg total) by mouth every 8 (eight) hours as needed for nausea or vomiting., Disp: 30 tablet, Rfl: 0 .  Probiotic Product ([employer] PROBIOTIC MAXIMUM STRENGTH) CAPS, Use as directed on package (Patient taking differently: Take 1 tablet by mouth daily as needed (stomach). ), Disp: 30 capsule, Rfl: 0 .  SUMAtriptan (IMITREX) 100 MG tablet, Take 100 mg by mouth every 2 (two) hours as needed for migraine. May repeat in 2 hours if headache persists or recurs., Disp: , Rfl:  .  topiramate (TOPAMAX) 25 MG tablet, Take 75 mg by mouth 2 (two) times daily., Disp: , Rfl:  .  [DISCONTINUED] diphenhydramine-acetaminophen (TYLENOL PM) 25-500 MG TABS, Take 1 tablet by mouth at bedtime as needed. Patient used medication for sleep. , Disp: , Rfl:   ROS: History  obtained from the patient  General ROS: negative for - chills, fatigue, fever, night sweats, weight gain or weight loss Psychological ROS: negative for - behavioral disorder, hallucinations, memory difficulties, mood swings or suicidal ideation Ophthalmic ROS: negative for - blurry vision, double vision, eye pain or loss of vision ENT ROS: negative for - epistaxis, nasal discharge, oral lesions, sore throat, tinnitus or vertigo Allergy and Immunology ROS: negative for - hives or itchy/watery eyes Hematological and Lymphatic ROS: negative for - bleeding problems, bruising or swollen lymph nodes Endocrine ROS: negative for - galactorrhea, hair pattern changes, polydipsia/polyuria or  temperature intolerance Respiratory ROS: negative for - cough, hemoptysis, shortness of breath or wheezing Cardiovascular ROS: negative for - chest pain, dyspnea on exertion, edema or irregular heartbeat Gastrointestinal ROS: negative for - abdominal pain, diarrhea, hematemesis, nausea/vomiting or stool incontinence Genito-Urinary ROS: negative for - dysuria, hematuria, incontinence or urinary frequency/urgency Musculoskeletal ROS: negative for - joint swelling or muscular weakness Neurological ROS: as noted in HPI Dermatological ROS: negative for rash and skin lesion changes  Physical Examination: Blood pressure 112/62, pulse 54, temperature 98.7 F (37.1 C), resp. rate 17, last menstrual period 12/09/2014, SpO2 100 %.  HEENT-  Normocephalic, no lesions, without obvious abnormality.  Normal external eye and conjunctiva.  Normal TM's bilaterally.  Normal auditory canals and external ears. Normal external nose, mucus membranes and septum.  Normal pharynx. Cardiovascular- S1, S2 normal, pulses palpable throughout   Lungs- chest clear, no wheezing, rales, normal symmetric air entry Abdomen- soft, non-tender; bowel sounds normal; no masses,  no organomegaly Extremities- no edema Lymph-no adenopathy palpable Musculoskeletal-no joint tenderness, deformity or swelling Skin-warm and dry, no hyperpigmentation, vitiligo, or suspicious lesions  Neurological Examination Mental Status: Alert, oriented, thought content appropriate.  Speech fluent without evidence of aphasia.  Able to follow 3 step commands without difficulty. Cranial Nerves: II: Discs flat bilaterally; Visual fields grossly normal, pupils equal, round, reactive to light and accommodation III,IV, VI: ptosis not present, extra-ocular motions intact bilaterally with nystagmus on forward gaze and left lateral gaze V,VII: smile symmetric, facial light touch sensation normal bilaterally VIII: hearing normal bilaterally IX,X: gag reflex  present XI: bilateral shoulder shrug XII: midline tongue extension Motor: Right : Upper extremity   5/5    Left:     Upper extremity   5/5  Lower extremity   5/5     Lower extremity   5/5 Tone and bulk:normal tone throughout; no atrophy noted Sensory: Pinprick and light touch intact throughout, bilaterally Deep Tendon Reflexes: 2+ and symmetric throughout Plantars: Right: downgoing   Left: downgoing Cerebellar: normal finger-to-nose and normal heel-to-shin testing bilaterally Gait: not tested due to pain   Laboratory Studies:   Basic Metabolic Panel: No results for input(s): NA, K, CL, CO2, GLUCOSE, BUN, CREATININE, CALCIUM, MG, PHOS in the last 168 hours.  Liver Function Tests: No results for input(s): AST, ALT, ALKPHOS, BILITOT, PROT, ALBUMIN in the last 168 hours. No results for input(s): LIPASE, AMYLASE in the last 168 hours. No results for input(s): AMMONIA in the last 168 hours.  CBC: No results for input(s): WBC, NEUTROABS, HGB, HCT, MCV, PLT in the last 168 hours.  Cardiac Enzymes: No results for input(s): CKTOTAL, CKMB, CKMBINDEX, TROPONINI in the last 168 hours.  BNP: Invalid input(s): POCBNP  CBG: No results for input(s): GLUCAP in the last 168 hours.  Microbiology: Results for orders placed or performed during the hospital encounter of 09/16/14  Clostridium Difficile by PCR     Status: None  Collection Time: 09/16/14  8:30 PM  Result Value Ref Range Status   C difficile by pcr NEGATIVE NEGATIVE Final    Coagulation Studies: No results for input(s): LABPROT, INR in the last 72 hours.  Urinalysis: No results for input(s): COLORURINE, LABSPEC, PHURINE, GLUCOSEU, HGBUR, BILIRUBINUR, KETONESUR, PROTEINUR, UROBILINOGEN, NITRITE, LEUKOCYTESUR in the last 168 hours.  Invalid input(s): APPERANCEUR  Lipid Panel:  No results found for: CHOL, TRIG, HDL, CHOLHDL, VLDL, LDLCALC  HgbA1C: No results found for: HGBA1C  Urine Drug Screen:  No results found for:  LABOPIA, COCAINSCRNUR, LABBENZ, AMPHETMU, THCU, LABBARB  Alcohol Level: No results for input(s): ETH in the last 168 hours.   Imaging: No results found.   Assessment/Plan: 39 year old female with a history of migraines presenting with persistent migraine headache.  Current headache not severe.  She does experience some rebound but has had Decadron today which should help with this.  Recommendations: 1.  Would repeat Decadron and give  dose quickly over 15 minutes 2.  Patient to follow up with outpatient neurologist  Thana Farr, MD [facility] [phone] 12/19/2014, 7:13 PM

## 2014-12-19 NOTE — Discharge Instructions (Signed)
If you were given medicines take as directed.  If you are on coumadin or contraceptives realize their levels and effectiveness is altered by many different medicines.  If you have any reaction (rash, tongues swelling, other) to the medicines stop taking and see a physician.   Please follow up as directed and return to the ER or see a physician for new or worsening symptoms.  Thank you. Filed Vitals:   12/19/14 1715 12/19/14 1745 12/19/14 1800 12/19/14 1830  BP: 129/66 117/56 120/68 112/62  Pulse: 75 90 65 54  Temp:      Resp: 23 17 23 17   SpO2: 100% 99% 100% 100%    Migraine Headache A migraine headache is very bad, throbbing pain on one or both sides of your head. Talk to your doctor about what things may bring on (trigger) your migraine headaches. HOME CARE  Only take medicines as told by your doctor.  Lie down in a dark, quiet room when you have a migraine.  Keep a journal to find out if certain things bring on migraine headaches. For example, write down:  What you eat and drink.  How much sleep you get.  Any change to your diet or medicines.  Lessen how much alcohol you drink.  Quit smoking if you smoke.  Get enough sleep.  Lessen any stress in your life.  Keep lights dim if bright lights bother you or make your migraines worse. GET HELP RIGHT AWAY IF:   Your migraine becomes really bad.  You have a fever.  You have a stiff neck.  You have trouble seeing.  Your muscles are weak, or you lose muscle control.  You lose your balance or have trouble walking.  You feel like you will pass out (faint), or you pass out.  You have really bad symptoms that are different than your first symptoms. MAKE SURE YOU:   Understand these instructions.  Will watch your condition.  Will get help right away if you are not doing well or get worse. Document Released: 05/10/2008 Document Revised: 10/24/2011 Document Reviewed: 04/08/2013 [facility] Patient Information 2015  [employer], [place]. This information is not intended to replace advice given to you by your health care provider. Make sure you discuss any questions you have with your health care provider.

## 2014-12-19 NOTE — ED Notes (Signed)
Pt reports to ED via POV for evaluation of migraine x3 days. Began at 5:30 am Wednesday morning and has progressively gotten worse. [facility] was given IM injections of migraine cocktail. Reports relief from that pain to 1/10, but migraine has come back now. Denies weakness. Reports dizziness, blurred vision, and tingling in bilateral hands. Pt reports hand tingling is "undesired side effect of my topamax." Pt has hx of migraines and reports blurred vision is side effect normally experienced as well as stiff neck. Denies recent illness. Pt is alert and oriented x4.

## 2014-12-19 NOTE — ED Notes (Signed)
Pt reports pain from migraine is better as long as she remains still, with movement pain returns.

## 2014-12-19 NOTE — ED Provider Notes (Signed)
Patient signed out to me to follow-up neurology recommendations. Headache improved in ER. Neurology does not feel she meets inpatient criteria and recommends outpatient follow-up. Further migraine treatments ordered in ER.  Intractable migraine with status migrainosus, unspecified migraine type    Blane OharaJoshua Baylin Cabal, MD 12/19/14 2001

## 2014-12-21 ENCOUNTER — Ambulatory Visit
Admission: RE | Admit: 2014-12-21 | Discharge: 2014-12-21 | Disposition: A | Discharge status: Home / Self Care | Payer: BC Managed Care – PPO | Source: Ambulatory Visit | Attending: Family | Admitting: Family

## 2014-12-21 DIAGNOSIS — K869 Disease of pancreas, unspecified: Secondary | ICD-10-CM

## 2014-12-21 MED ORDER — GADOBENATE DIMEGLUMINE 529 MG/ML IV SOLN
14.0000 mL | Freq: Once | INTRAVENOUS | Status: AC | PRN
  Administered 2014-12-21: 14 mL via INTRAVENOUS

## 2014-12-22 ENCOUNTER — Telehealth: Payer: Self-pay | Admitting: Family

## 2014-12-22 NOTE — Telephone Encounter (Signed)
Pt aware of results 

## 2014-12-22 NOTE — Telephone Encounter (Signed)
Patient called again hoping she can get a call back this afternoon.

## 2014-12-22 NOTE — Telephone Encounter (Signed)
Please call and inform the patient that her MRI results show that her pancreatic lesion shows no abnormal associated enhancement and it is recommended that we follow up with an additional MRI in 1 year. The lesion on her adrenal gland is small and favors an adenoma which is benign. Therefore there is no further intervention recommended at this time and obtain a follow up MRI in 1 year.   IMPRESSION: 1. Tubular fluid signal intensity at the junction of the pancreatic body and tail probably does connect up to the dorsal pancreatic duct, but without associated dilatation of the dorsal pancreatic duct. This lesion currently measures 1.8 by 0.8 cm and has no abnormal associated enhancement or appreciable septation. Based on current guidelines, this should be followed up with a pancreatic protocol MRI in 1 years time. This recommendation follows ACR consensus guidelines: Managing Incidental Findings on Abdominal CT: White Paper of the ACR Incidental Findings Committee. J Am Coll Radiol 2010;7:754-773 2. 10 mm nodule in the left adrenal gland is difficult to characterize due to small size, but appears to demonstrate enough dropout of signal on out of phase images 2 favor adrenal adenoma. 3. Tiny subcapsular cyst in segment 4 of the liver, benign. 4. Tiny nonenhancing cystic lesion in the

## 2014-12-22 NOTE — Telephone Encounter (Signed)
Tammy SoursGreg has to take a look at it to let me know what to tell her.

## 2014-12-22 NOTE — Telephone Encounter (Signed)
Patient is requesting call back with MRI results

## 2015-01-13 ENCOUNTER — Ambulatory Visit: Payer: BC Managed Care – PPO | Admitting: Sports Medicine

## 2015-01-17 ENCOUNTER — Encounter: Payer: Self-pay | Admitting: Family Medicine

## 2015-01-17 ENCOUNTER — Ambulatory Visit (INDEPENDENT_AMBULATORY_CARE_PROVIDER_SITE_OTHER): Payer: BC Managed Care – PPO | Admitting: Family Medicine

## 2015-01-17 VITALS — BP 106/76 | HR 58 | Temp 98.0°F | Wt 162.0 lb

## 2015-01-17 DIAGNOSIS — Z91048 Other nonmedicinal substance allergy status: Secondary | ICD-10-CM | POA: Diagnosis not present

## 2015-01-17 DIAGNOSIS — G43809 Other migraine, not intractable, without status migrainosus: Secondary | ICD-10-CM | POA: Diagnosis not present

## 2015-01-17 DIAGNOSIS — Z9109 Other allergy status, other than to drugs and biological substances: Secondary | ICD-10-CM

## 2015-01-17 NOTE — Progress Notes (Signed)
Pre visit review using our clinic review tool, if applicable. No additional management support is needed unless otherwise documented below in the visit note. 

## 2015-01-17 NOTE — Progress Notes (Signed)
   Subjective:    Patient ID: Kimberly Fernandez, female    DOB: 08/02/1976, 39 y.o.   MRN: [id number]  HPI Here for frequent headaches and sinus congestion. No recent fever or ST or cough. She has been tested for allergies over the years but nothing has shown up. She has used nasal sprays and antihistamines over the years with mixed success. She has been treated for migraines, but she wonders if the real underlying problem has always been her sinuses. She has never used a steroid nasal spray consistently however. She tried Topamax as a prevention agent but this did not help. She uses Imitrex when she gets a HA but this does not help.    Review of Systems  Constitutional: Negative.   HENT: Positive for congestion, postnasal drip and sinus pressure.   Eyes: Negative.   Respiratory: Negative.   Neurological: Positive for headaches.       Objective:   Physical Exam  Constitutional: She is oriented to person, place, and time. She appears well-developed and well-nourished.  HENT:  Head: Normocephalic and atraumatic.  Right Ear: External ear normal.  Left Ear: External ear normal.  Nose: Nose normal.  Mouth/Throat: Oropharynx is clear and moist.  Eyes: Conjunctivae and EOM are normal. Pupils are equal, round, and reactive to light.  Neck: Neck supple. No JVD present. No thyromegaly present.  Pulmonary/Chest: Effort normal and breath sounds normal.  Neurological: She is alert and oriented to person, place, and time.          Assessment & Plan:  She has frequent HAs that may be a mixture of both sinus problems and migraines. I advised her to treat the sinus problem thoroughly and consistently. She will take Mucinex 1200 mg bid, Allegra daily, and Flonase nasal spray daily. If she does not feel better in a few weeks, follow up with her PCP

## 2015-01-20 ENCOUNTER — Encounter: Payer: Self-pay | Admitting: Sports Medicine

## 2015-01-20 ENCOUNTER — Ambulatory Visit (INDEPENDENT_AMBULATORY_CARE_PROVIDER_SITE_OTHER): Payer: BC Managed Care – PPO | Admitting: Sports Medicine

## 2015-01-20 VITALS — BP 133/72 | Ht 64.0 in | Wt 154.0 lb

## 2015-01-20 DIAGNOSIS — M79605 Pain in left leg: Secondary | ICD-10-CM

## 2015-01-20 DIAGNOSIS — S86112A Strain of other muscle(s) and tendon(s) of posterior muscle group at lower leg level, left leg, initial encounter: Secondary | ICD-10-CM

## 2015-01-20 DIAGNOSIS — S86892D Other injury of other muscle(s) and tendon(s) at lower leg level, left leg, subsequent encounter: Secondary | ICD-10-CM | POA: Diagnosis not present

## 2015-01-21 NOTE — Progress Notes (Signed)
  Kimberly Fernandez - 39 y.o. female MRN [id number]  Date of birth: [birth year]  SUBJECTIVE:  Including CC & ROS.  Patient is a 39 yo female present for f/u left low leg pain that has been present for 6-8 weeks. Patient was last seen in April and Dx for a calf strain. Patient was tx with compression sleeve, HEP and relative rest. Patient reports her calf pain improved and she continued training and ran her planned 1/2 marathon about 2 week which did not go well after only 8 miles into the race she started getting severe anterior tibial shin pain with some pain radiating down from distal tib down to anterior foot. She has been icing an not running for the past 2 weeks. She has not been taking any NSAIDS. She denies any pain with walking. No swelling or bruising present. Denies any previous history of stress fracture.   ROS: Review of systems otherwise negative except for information present in HPI  HISTORY: Past Medical, Surgical, Social, and Family History Reviewed & Updated per EMR. Pertinent Historical Findings include: Hiatal hernia and history of acid reflux  DATA REVIEWED: No other imaging available  PHYSICAL EXAM:  VS: BP:133/72 mmHg  HR: bpm  TEMP: ( )  RESP:   HT:5\' 4"  (162.6 cm)   WT:154 lb (69.854 kg)  BMI:26.5 Left lower extremity EXAM:  General: well nourished Skin of LE: warm; dry, no rashes, lesions, ecchymosis or erythema. Vascular: Dorsal pedal pulses 2+ bilaterally Neurologically: Sensation to light touch lower extremities equal and intact bilaterally.  Observation - no ecchymosis, no edema, or hematoma present  Palpation: Severe tenderness to palpation approximately 6 cm above the medial malleolus along the anterior and medial tibial shaft. ROM: Normal ankle motion  Muscle strength: Normal strength in dorsiflexion, plantar flexion, inversion, eversion and phalanx extension/flexion 5/5 strength bilaterally.   Normal weight bearing arch with pes cavus collapse Normal calf  strength with no pain on heel rise. Some pain with hop test.  MSK US: Ultrasound exam reveals resolution of left lower leg calf strain with no structure in the gluteus medius. Thorough examination of the anterior tibial shaft does not Ines of obvious stress fracture the patient does have some periosteum hypoechoic fluid collection and neovascularization  ASSESSMENT & PLAN: See problem based charting & AVS for pt instructions. Impression: Left lower extremity medial tibial stress reaction  Recommendations: -Discuss with patient that given her history of recently worsening anterior tibial pain fairly rapid increase in intensity of running and preparations for half marathon she is at risk for stress fracture. -Ultrasound exam did not reveal presence of an obvious fracture but there was some periosteum neovascularization they do support further imaging. -We'll send patient for two-view tib-fib x-rays. If these are negative would recommend proceeding with MRI to further classify possible tibial stress fracture. -Patient encouraged to continue compression sleeve, icing, and relative rest

## 2015-01-22 ENCOUNTER — Ambulatory Visit
Admission: RE | Admit: 2015-01-22 | Discharge: 2015-01-22 | Disposition: A | Discharge status: Home / Self Care | Payer: BC Managed Care – PPO | Source: Ambulatory Visit | Attending: Sports Medicine | Admitting: Sports Medicine

## 2015-01-22 DIAGNOSIS — M79605 Pain in left leg: Secondary | ICD-10-CM

## 2015-01-22 DIAGNOSIS — S86112A Strain of other muscle(s) and tendon(s) of posterior muscle group at lower leg level, left leg, initial encounter: Secondary | ICD-10-CM

## 2015-01-23 ENCOUNTER — Other Ambulatory Visit: Payer: Self-pay | Admitting: *Deleted

## 2015-01-23 DIAGNOSIS — S86892D Other injury of other muscle(s) and tendon(s) at lower leg level, left leg, subsequent encounter: Secondary | ICD-10-CM

## 2015-02-05 ENCOUNTER — Ambulatory Visit (HOSPITAL_COMMUNITY)
Admission: RE | Admit: 2015-02-05 | Discharge: 2015-02-05 | Disposition: A | Discharge status: Home / Self Care | Payer: BC Managed Care – PPO | Source: Ambulatory Visit | Attending: Sports Medicine | Admitting: Sports Medicine

## 2015-02-05 DIAGNOSIS — R609 Edema, unspecified: Secondary | ICD-10-CM | POA: Insufficient documentation

## 2015-02-05 DIAGNOSIS — M79662 Pain in left lower leg: Secondary | ICD-10-CM | POA: Insufficient documentation

## 2015-02-05 DIAGNOSIS — S86892D Other injury of other muscle(s) and tendon(s) at lower leg level, left leg, subsequent encounter: Secondary | ICD-10-CM

## 2015-02-06 ENCOUNTER — Encounter: Payer: Self-pay | Admitting: Sports Medicine

## 2015-02-09 NOTE — Telephone Encounter (Signed)
-----   Message from Kimberly Smoker, DO sent at 02/06/2015 12:26 PM EDT ----- Please notify the patient her MRI of her lower leg was negative for stress fracture. She did have some swelling and edema around the anterior tibialis tendon which is from over use. At this point I would take some time off from running and follow up in the office in 2-3 week to re-assess her gait and running mechanic to hopefully present further tendonitis problems.

## 2015-02-09 NOTE — Telephone Encounter (Signed)
Spoke to patient and she said she has been using compression socks which has helped the swelling. She also hasn't been exercising or running for over a week now.  She will make a follow up appt soon to go over gait and running mechanics.

## 2015-02-21 ENCOUNTER — Telehealth: Payer: BC Managed Care – PPO | Admitting: Family

## 2015-02-21 DIAGNOSIS — R51 Headache: Principal | ICD-10-CM

## 2015-02-21 DIAGNOSIS — R519 Headache, unspecified: Secondary | ICD-10-CM

## 2015-02-21 NOTE — Progress Notes (Signed)
Based on what you shared with me it looks like you have a serious condition that should be evaluated in a face to face office visit.  It could be some slight sinus issues making your headache worse, but hard to diagnose not face to face.  If you are having a true medical emergency please call 911.  If you need an urgent face to face visit, [facility] has four urgent care centers for your convenience.  Tressie Ellis. [facility]  [phone] Get Driving Directions Find a Provider at this Location  [address] [place], [place] [address] . 8 am to 8 pm Monday-Friday . 9 am to 7 pm Saturday-Sunday  . [facility]  [phone] Get Driving Directions Find a Provider at this Location  [address] [place], [place] [address] . 8 am to 8 pm Monday-Friday . 9 am to 6 pm Saturday . 11 am to 6 pm Sunday   . [facility]  [phone] Get Driving Directions  [address] [place], [place] [address] . 8 am to 8 pm Monday-Friday . 9 am to 4 pm Saturday-Sunday   . [facility] (a walk in primary care provider)  [phone]  Get Driving Directions Find a Provider at this Location  [address] [place], [place] [address] . 8 am to 8:30 pm Monday-Thursday . 8 am to 6 pm Friday . 8 am to 4 pm Saturday-Sunday   Your e-visit answers were reviewed by a board certified advanced clinical practitioner to complete your personal care plan.  Depending on the condition, your plan could have included both over the counter or prescription medications.  You will get an e-mail in the next two days asking about your experience.  I hope that your e-visit has been valuable and will speed your recovery . Thank you for choosing an e-visit.

## 2015-02-24 ENCOUNTER — Encounter: Payer: Self-pay | Admitting: Family

## 2015-02-24 ENCOUNTER — Ambulatory Visit (INDEPENDENT_AMBULATORY_CARE_PROVIDER_SITE_OTHER): Payer: BC Managed Care – PPO | Admitting: Family

## 2015-02-24 VITALS — BP 130/88 | HR 64 | Temp 98.0°F | Resp 18 | Ht 64.0 in | Wt 164.8 lb

## 2015-02-24 DIAGNOSIS — G43909 Migraine, unspecified, not intractable, without status migrainosus: Secondary | ICD-10-CM

## 2015-02-24 DIAGNOSIS — F411 Generalized anxiety disorder: Secondary | ICD-10-CM

## 2015-02-24 DIAGNOSIS — F418 Other specified anxiety disorders: Secondary | ICD-10-CM | POA: Insufficient documentation

## 2015-02-24 MED ORDER — SERTRALINE HCL 50 MG PO TABS: 50.0000 mg | ORAL_TABLET | Freq: Every day | ORAL | Status: DC

## 2015-02-24 MED ORDER — ISOMETHEPTENE-DICHLORAL-APAP 65-100-325 MG PO CAPS: ORAL_CAPSULE | ORAL | Status: DC

## 2015-02-24 NOTE — Assessment & Plan Note (Signed)
Symptoms and exam consistent with migraine headache which is refractory to Topamax and Imitrex. Start Midrin. Discuss possible prophylactic treatment with propanolol if headaches continue. May possibly be related to stress. Continue with abortive therapy at this time and consider preventative therapy if decrease in anxiety does not reduce headaches. Follow-up in one month.

## 2015-02-24 NOTE — Assessment & Plan Note (Signed)
Symptoms consistent with anxiety related to work and family stressors. Start sertraline. Continue current dose of Xanax. If medication causes excessive drowsiness, consider taking half a pill. Advised to monitor for signs of suicidal ideation when starting sertraline. Follow up in 1 month to determine effectiveness.

## 2015-02-24 NOTE — Progress Notes (Signed)
Pre visit review using our clinic review tool, if applicable. No additional management support is needed unless otherwise documented below in the visit note. 

## 2015-02-24 NOTE — Progress Notes (Signed)
Subjective:    Patient ID: Kimberly Fernandez, female    DOB: [birth year], 39 y.o.   MRN: [id number]  Chief Complaint  Patient presents with  . Migraine    states that she was dxs with migraines and thinks it might be anxiety related, gets chest tightness has had alot going on the last few weeks    HPI:  Kimberly Fernandez is a 39 y.o. female with a PMH of albinism, hiatal hernia, migrant headaches, pregnancy-induced hypertension, and anxiety and depression who presents today for office follow-up.    1.) Headaches - Previously diagnosed with migraine headaches. Notes that there is always an underlying headache. Pain is described as pulsating and throbbing with associated symptoms of sensitivity to light and sound with nausea and no vomiting. Modifying factors include Topamax and imitrex which have not helped. Does not note any particular triggers for her headaches. Notes they started a few days after a significant amount of family and medical stress.   2.) Anxiety - Associated symptom of anxiety has been going on for a couple years and has been constant for the past 2-3 weeks. Thinks that part of her headache trigger may be anxiety because she does have the occasional chest pain without shortness of breath or heart palpitations. Did have a panic attack about 1 year ago. Modifying factors include Xanax which does help when she is able to take it.    Allergies  Allergen Reactions  . Doxycycline Rash    Current Outpatient Prescriptions on File Prior to Visit  Medication Sig Dispense Refill  . ALPRAZolam (XANAX) 0.25 MG tablet Take 1 tablet (0.25 mg total) by mouth 2 (two) times daily as needed for anxiety. (Patient taking differently: Take 0.25 mg by mouth daily as needed for anxiety (diagnostic testing). ) 4 tablet 0  . diphenhydrAMINE (SOMINEX) 25 MG tablet Take 25 mg by mouth at bedtime as needed for sleep (migraines).    . Ibuprofen (ADVIL MIGRAINE) 200 MG CAPS Take 200 mg by mouth every 6 (six)  hours as needed (migraines).     . norethindrone (MICRONOR,CAMILA,ERRIN) 0.35 MG tablet Take 1 tablet by mouth daily.    . ondansetron (ZOFRAN) 4 MG tablet Take 1 tablet (4 mg total) by mouth every 8 (eight) hours as needed for nausea or vomiting. 30 tablet 0  . [DISCONTINUED] diphenhydramine-acetaminophen (TYLENOL PM) 25-500 MG TABS Take 1 tablet by mouth at bedtime as needed. Patient used medication for sleep.      No current facility-administered medications on file prior to visit.    Past Medical History  Diagnosis Date  . Reflux   . Asthma   . Albinism   . Nystagmus   . Depression   . Anxiety   . GERD (gastroesophageal reflux disease)   . Migraines     Review of Systems  Respiratory: Negative for chest tightness and shortness of breath.   Cardiovascular: Negative for chest pain, palpitations and leg swelling.  Gastrointestinal: Positive for nausea.  Neurological: Positive for headaches.      Objective:    BP 130/88 mmHg  Pulse 64  Temp(Src) 98 F (36.7 C) (Oral)  Resp 18  Ht 5\' 4"  (1.626 m)  Wt 164 lb 12.8 oz (74.753 kg)  BMI 28.27 kg/m2  SpO2 96% Nursing note and vital signs reviewed.  Physical Exam  Constitutional: She is oriented to person, place, and time. She appears well-developed and well-nourished. No distress.  Cardiovascular: Normal rate, regular rhythm, normal heart sounds and intact distal  pulses.   Pulmonary/Chest: Effort normal and breath sounds normal.  Neurological: She is alert and oriented to person, place, and time. She has normal reflexes. No cranial nerve deficit. Coordination normal.  Skin: Skin is warm and dry.  Psychiatric: Her behavior is normal. Judgment and thought content normal. Her mood appears anxious.       Assessment & Plan:   Problem List Items Addressed This Visit      Cardiovascular and Mediastinum   Migraine    Symptoms and exam consistent with migraine headache which is refractory to Topamax and Imitrex. Start Midrin.  Discuss possible prophylactic treatment with propanolol if headaches continue. May possibly be related to stress. Continue with abortive therapy at this time and consider preventative therapy if decrease in anxiety does not reduce headaches. Follow-up in one month.      Relevant Medications   isometheptene-acetaminophen-dichloralphenazone (MIDRIN) 65-100-325 MG capsule   sertraline (ZOLOFT) 50 MG tablet     Other   Anxiety state - Primary    Symptoms consistent with anxiety related to work and family stressors. Start sertraline. Continue current dose of Xanax. If medication causes excessive drowsiness, consider taking half a pill. Advised to monitor for signs of suicidal ideation when starting sertraline. Follow up in 1 month to determine effectiveness.       Relevant Medications   sertraline (ZOLOFT) 50 MG tablet

## 2015-02-24 NOTE — Patient Instructions (Signed)
Thank you for choosing [facility].  Summary/Instructions:  Your prescription(s) have been submitted to your pharmacy or been printed and provided for you. Please take as directed and contact our office if you believe you are having problem(s) with the medication(s) or have any questions.  If your symptoms worsen or fail to improve, please contact our office for further instruction, or in case of emergency go directly to the emergency room at the closest medical facility.    .Sertraline tablets What is this medicine? SERTRALINE (SER tra leen) is used to treat depression. It may also be used to treat obsessive compulsive disorder, panic disorder, post-trauma stress, premenstrual dysphoric disorder (PMDD) or social anxiety. This medicine may be used for other purposes; ask your health care provider or pharmacist if you have questions. COMMON BRAND NAME(S): Zoloft What should I tell my health care provider before I take this medicine? They need to know if you have any of these conditions: -bipolar disorder or a family history of bipolar disorder -diabetes -glaucoma -heart disease -high blood pressure -history of irregular heartbeat -history of low levels of calcium, magnesium, or potassium in the blood -if you often drink alcohol -liver disease -receiving electroconvulsive therapy -seizures -suicidal thoughts, plans, or attempt; a previous suicide attempt by you or a family member -thyroid disease -an unusual or allergic reaction to sertraline, other medicines, foods, dyes, or preservatives -pregnant or trying to get pregnant -breast-feeding How should I use this medicine? Take this medicine by mouth with a glass of water. Follow the directions on the prescription label. You can take it with or without food. Take your medicine at regular intervals. Do not take your medicine more often than directed. Do not stop taking this medicine suddenly except upon the advice of your doctor.  Stopping this medicine too quickly may cause serious side effects or your condition may worsen. A special MedGuide will be given to you by the pharmacist with each prescription and refill. Be sure to read this information carefully each time. Talk to your pediatrician regarding the use of this medicine in children. While this drug may be prescribed for children as young as 7 years for selected conditions, precautions do apply. Overdosage: If you think you have taken too much of this medicine contact a poison control center or emergency room at once. NOTE: This medicine is only for you. Do not share this medicine with others. What if I miss a dose? If you miss a dose, take it as soon as you can. If it is almost time for your next dose, take only that dose. Do not take double or extra doses. What may interact with this medicine? Do not take this medicine with any of the following medications: -certain medicines for fungal infections like fluconazole, itraconazole, ketoconazole, posaconazole, voriconazole -cisapride -disulfiram -dofetilide -linezolid -MAOIs like Carbex, Eldepryl, Marplan, Nardil, and Parnate -metronidazole -methylene blue (injected into a vein) -pimozide -thioridazine -ziprasidone This medicine may also interact with the following medications: -alcohol -aspirin and aspirin-like medicines -certain medicines for depression, anxiety, or psychotic disturbances -certain medicines for irregular heart beat like flecainide, propafenone -certain medicines for migraine headaches like almotriptan, eletriptan, frovatriptan, naratriptan, rizatriptan, sumatriptan, zolmitriptan -certain medicines for sleep -certain medicines for seizures like carbamazepine, valproic acid, phenytoin -certain medicines that treat or prevent blood clots like warfarin, enoxaparin, dalteparin -cimetidine -digoxin -diuretics -fentanyl -furazolidone -isoniazid -lithium -NSAIDs, medicines for pain and  inflammation, like ibuprofen or naproxen -other medicines that prolong the QT interval (cause an abnormal heart rhythm) -procarbazine -  rasagiline -supplements like St. John's wort, kava kava, valerian -tolbutamide -tramadol -tryptophan This list may not describe all possible interactions. Give your health care provider a list of all the medicines, herbs, non-prescription drugs, or dietary supplements you use. Also tell them if you smoke, drink alcohol, or use illegal drugs. Some items may interact with your medicine. What should I watch for while using this medicine? Tell your doctor if your symptoms do not get better or if they get worse. Visit your doctor or health care professional for regular checks on your progress. Because it may take several weeks to see the full effects of this medicine, it is important to continue your treatment as prescribed by your doctor. Patients and their families should watch out for new or worsening thoughts of suicide or depression. Also watch out for sudden changes in feelings such as feeling anxious, agitated, panicky, irritable, hostile, aggressive, impulsive, severely restless, overly excited and hyperactive, or not being able to sleep. If this happens, especially at the beginning of treatment or after a change in dose, call your health care professional. Bonita Quin may get drowsy or dizzy. Do not drive, use machinery, or do anything that needs mental alertness until you know how this medicine affects you. Do not stand or sit up quickly, especially if you are an older patient. This reduces the risk of dizzy or fainting spells. Alcohol may interfere with the effect of this medicine. Avoid alcoholic drinks. Your mouth may get dry. Chewing sugarless gum or sucking hard candy, and drinking plenty of water may help. Contact your doctor if the problem does not go away or is severe. What side effects may I notice from receiving this medicine? Side effects that you should report  to your doctor or health care professional as soon as possible: -allergic reactions like skin rash, itching or hives, swelling of the face, lips, or tongue -black or bloody stools, blood in the urine or vomit -fast, irregular heartbeat -feeling faint or lightheaded, falls -hallucination, loss of contact with reality -seizures -suicidal thoughts or other mood changes -unusual bleeding or bruising -unusually weak or tired -vomiting Side effects that usually do not require medical attention (report to your doctor or health care professional if they continue or are bothersome): -change in appetite -change in sex drive or performance -diarrhea -increased sweating -indigestion, nausea -tremors This list may not describe all possible side effects. Call your doctor for medical advice about side effects. You may report side effects to FDA at 1-800-FDA-1088. Where should I keep my medicine? Keep out of the reach of children. Store at room temperature between 15 and 30 degrees C (59 and 86 degrees F). Throw away any unused medicine after the expiration date. NOTE: This sheet is a summary. It may not cover all possible information. If you have questions about this medicine, talk to your doctor, pharmacist, or health care provider.  2015, Elsevier/Gold Standard. (2013-02-26 12:57:35)   Acetaminophen; Dichloralphenazone; Isometheptene capsules What is this medicine? ACETAMINOPHEN; DICHLORALPHENAZONE; ISOMETHEPTENE (a seet a MIN oh fen; dye klor al PHEN a zone; eye soe me THEP teen) is a pain reliever. It is used to treat migraine or tension headaches. This medicine may be used for other purposes; ask your health care provider or pharmacist if you have questions. COMMON BRAND NAME(S): Amidrine, Diacetazone, Duradrin, I.D.A., Midrin, Migquin, Migragesic IDA, Migratine, Migrazone, Migrin-A, Nodolor What should I tell my health care provider before I take this medicine? They need to know if you have  any of  these conditions: -glaucoma -heart disease or recent heart attack -high blood pressure, other blood vessel disorder -if you often drink alcohol -kidney disease -liver disease -an unusual or allergic reaction to acetaminophen, dichloralphenazone, isometheptene, other medicines, foods, dyes or preservatives -pregnant or trying to get pregnant -breast-feeding How should I use this medicine? Take this medicine by mouth with a glass of water. Follow the directions on the prescription label. If taking for migraine headache, do not take more than 5 capsules in any 12-hour period. If taking for tension headache, do not take more than 8 capsules a day. Do not take your medicine more often than directed. Talk to your pediatrician regarding the use of this medicine in children. Overdosage: If you think you have taken too much of this medicine contact a poison control center or emergency room at once. NOTE: This medicine is only for you. Do not share this medicine with others. What if I miss a dose? If you miss a dose, take it as soon as you can. If it is almost time for your next dose, take only that dose. Do not take double or extra doses. What may interact with this medicine? Do not take this medicine with any of the following medications: -bromocriptine -disulfiram -MAOIs like Carbex, Eldepryl, Marplan, Nardil, and Parnate -procarbazine This medicine may also interact with the following medications: -acetaminophen -alcohol -antacids -antihistamines -furazolidone -isoniazid -linezoid -medicines for high blood pressure -some medicines for depression, anxiety, or psychotic disturbances -some medicines for sleep -ritonavir -warfarin This list may not describe all possible interactions. Give your health care provider a list of all the medicines, herbs, non-prescription drugs, or dietary supplements you use. Also tell them if you smoke, drink alcohol, or use illegal drugs. Some items may  interact with your medicine. What should I watch for while using this medicine? Visit your doctor or health care provider for regular check ups. Tell your doctor if your pain does not go away, if it gets worse, or if you have new or a different type of pain. Do not share this medicine with anyone else. This medicine contains acetaminophen. Many over the counter medicines also contain acetaminophen as an ingredient. To prevent overdose, read labels carefully. Do not take this medicine with other acetaminophen products. You may get drowsy or dizzy. Do not drive, use machinery, or do anything that needs mental alertness until you know how this medicine affects you. Do not stand or sit up quickly, especially if you are an older patient. This reduces the risk of dizzy or fainting spells. Alcohol may interfere with the effect of this medicine. Avoid alcoholic drinks. If you take migraine medicines for 10 or more days a month, your migraines may get worse. Keep a diary of headache days and medicine use. Contact your healthcare professional if your migraine attacks occur more frequently. What side effects may I notice from receiving this medicine? Side effects that you should report to your doctor or health care professional as soon as possible: -allergic reactions like skin rash, itching or hives, swelling of the face, lips, or tongue -difficulty breathing, wheezing -fast, irregular heartbeat -fever, sore throat -redness, blistering, peeling or loosening of the skin, including inside the mouth -unusual bleeding or bruising -unusually weak or tired Side effects that usually do not require medical attention (report to your doctor or health care professional if they continue or are bothersome): -headache -nausea -upset stomach This list may not describe all possible side effects. Call your doctor for medical advice about  side effects. You may report side effects to FDA at 1-800-FDA-1088. Where should I  keep my medicine? Keep out of the reach of children. This medicine can be abused. Keep your medicine in a safe place to protect it from theft. Do not share this medicine with anyone. Selling or giving away this medicine is dangerous and against the law. Store at room temperature between 15 and 30 degrees C (59 and 86 degrees F). Keep container tightly closed. Throw away any unused medicine after the expiration date. NOTE: This sheet is a summary. It may not cover all possible information. If you have questions about this medicine, talk to your doctor, pharmacist, or health care provider.  2015, Elsevier/Gold Standard. (2013-04-02 11:07:25)

## 2015-05-20 ENCOUNTER — Encounter: Payer: Self-pay | Admitting: Internal Medicine

## 2015-05-20 ENCOUNTER — Ambulatory Visit (INDEPENDENT_AMBULATORY_CARE_PROVIDER_SITE_OTHER): Payer: BC Managed Care – PPO | Admitting: Internal Medicine

## 2015-05-20 VITALS — BP 112/84 | HR 51 | Temp 97.9°F | Wt 170.0 lb

## 2015-05-20 DIAGNOSIS — J0100 Acute maxillary sinusitis, unspecified: Secondary | ICD-10-CM | POA: Diagnosis not present

## 2015-05-20 DIAGNOSIS — J020 Streptococcal pharyngitis: Secondary | ICD-10-CM

## 2015-05-20 DIAGNOSIS — J019 Acute sinusitis, unspecified: Secondary | ICD-10-CM | POA: Insufficient documentation

## 2015-05-20 LAB — POCT RAPID STREP A (OFFICE): Rapid Strep A Screen: NEGATIVE

## 2015-05-20 MED ORDER — CEFUROXIME AXETIL 500 MG PO TABS: 500.0000 mg | ORAL_TABLET | Freq: Two times a day (BID) | ORAL | Status: DC

## 2015-05-20 MED ORDER — FLUCONAZOLE 150 MG PO TABS: 150.0000 mg | ORAL_TABLET | Freq: Once | ORAL | Status: DC

## 2015-05-20 NOTE — Patient Instructions (Signed)
Use over-the-counter  "cold" medicines  such as "Tylenol cold" , "Advil cold",  "Mucinex" or" Mucinex D"  for cough and congestion.   Avoid decongestants if you have high blood pressure and use "Afrin" nasal spray for nasal congestion as directed instead. Use" Delsym" or" Robitussin" cough syrup varietis for cough.  You can use plain "Tylenol" or "Advil" for fever, chills and achyness. Use Halls or Ricola cough drops.    

## 2015-05-20 NOTE — Progress Notes (Signed)
Pre visit review using our clinic review tool, if applicable. No additional management support is needed unless otherwise documented below in the visit note. 

## 2015-05-20 NOTE — Assessment & Plan Note (Signed)
Ceftin Diflucan

## 2015-05-20 NOTE — Progress Notes (Signed)
Subjective:  Patient ID: Kimberly Fernandez, female    DOB: [birth year]  Age: 39 y.o. MRN: [id number]  CC: No chief complaint on file.   HPI Kimberly Fernandez presents for HA, ST, green nasal d/c. Kids w/strep now  Outpatient Prescriptions Prior to Visit  Medication Sig Dispense Refill  . ALPRAZolam (XANAX) 0.25 MG tablet Take 1 tablet (0.25 mg total) by mouth 2 (two) times daily as needed for anxiety. (Patient taking differently: Take 0.25 mg by mouth daily as needed for anxiety (diagnostic testing). ) 4 tablet 0  . diphenhydrAMINE (SOMINEX) 25 MG tablet Take 25 mg by mouth at bedtime as needed for sleep (migraines).    . Ibuprofen (ADVIL MIGRAINE) 200 MG CAPS Take 200 mg by mouth every 6 (six) hours as needed (migraines).     . isometheptene-acetaminophen-dichloralphenazone (MIDRIN) 65-100-325 MG capsule Take 2 capsules at the onset of headache and 1 capsule hourly up to 5 capsules in 12 hours. 30 capsule 0  . norethindrone (MICRONOR,CAMILA,ERRIN) 0.35 MG tablet Take 1 tablet by mouth daily.    . ondansetron (ZOFRAN) 4 MG tablet Take 1 tablet (4 mg total) by mouth every 8 (eight) hours as needed for nausea or vomiting. 30 tablet 0  . sertraline (ZOLOFT) 50 MG tablet Take 1 tablet (50 mg total) by mouth daily. 30 tablet 3   No facility-administered medications prior to visit.    ROS Review of Systems  Constitutional: Negative for fever.  HENT: Positive for congestion, postnasal drip, rhinorrhea, sinus pressure and sore throat.     Objective:  BP 112/84 mmHg  Pulse 51  Temp(Src) 97.9 F (36.6 C) (Oral)  Wt 170 lb (77.111 kg)  SpO2 98%  BP Readings from Last 3 Encounters:  05/20/15 112/84  02/24/15 130/88  01/20/15 133/72    Wt Readings from Last 3 Encounters:  05/20/15 170 lb (77.111 kg)  02/24/15 164 lb 12.8 oz (74.753 kg)  01/20/15 154 lb (69.854 kg)    Physical Exam  Constitutional: She appears well-developed. No distress.  HENT:  Head: Normocephalic.  Right Ear:  External ear normal.  Left Ear: External ear normal.  Nose: Nose normal.  Eyes: Conjunctivae are normal. Pupils are equal, round, and reactive to light. Right eye exhibits no discharge. Left eye exhibits no discharge.  Neck: Normal range of motion. Neck supple. No JVD present. No tracheal deviation present. No thyromegaly present.  Cardiovascular: Normal rate, regular rhythm and normal heart sounds.   Pulmonary/Chest: No stridor. No respiratory distress. She has no wheezes.  Abdominal: Soft. Bowel sounds are normal. She exhibits no distension and no mass. There is no tenderness. There is no rebound and no guarding.  Musculoskeletal: She exhibits no edema or tenderness.  Lymphadenopathy:    She has no cervical adenopathy.  Neurological: She displays normal reflexes. No cranial nerve deficit. She exhibits normal muscle tone. Coordination normal.  Skin: No rash noted. No erythema.  Psychiatric: She has a normal mood and affect. Her behavior is normal. Judgment and thought content normal.  eryth throat, nares w/d/c  Lab Results  Component Value Date   WBC 8.5 09/16/2014   HGB 14.4 09/16/2014   HCT 41.2 09/16/2014   PLT 180 09/16/2014   GLUCOSE 114* 09/16/2014   ALT 15 09/16/2014   AST 18 09/16/2014   NA 137 09/16/2014   K 3.8 09/16/2014   CL 107 09/16/2014   CREATININE 0.79 09/16/2014   BUN 6 09/16/2014   CO2 27 09/16/2014    Mr Tibia Fibula  Left Wo Contrast  02/05/2015   CLINICAL DATA:  Anterior left tibial pain.  EXAM: MRI OF THE LEFT LOWER LEG WITHOUT CONTRAST  TECHNIQUE: Multiplanar, multisequence MR imaging of the left lower leg was performed. No intravenous contrast was administered.  COMPARISON:  Radiographs dated 01/22/2015  FINDINGS: There is no bone edema or periosteal edema. The osseous structures of the left tibia and fibula are normal. There are no knee or ankle joint effusions. There is slight edema in the soft tissues around the distal tibialis anterior muscle and tendon  5 cm above the ankle joint.  There is a subtle 14 x 8 x 4 mm area increased signal in the marrow of the lateral aspect of the proximal left tibial shaft which I suspect represents a small area of red marrow. There is no bone destruction or worrisome mass.  IMPRESSION: 1. No evidence of tibial stress fracture or stress reaction. 2. Slight edema in around the distal anterior tibialis tendon and muscle in the distal left lower leg which may represent mild strain. 3. Tiny area of probable normal red marrow in the proximal tibia.   Electronically Signed   By: Francene Boyers M.D.   On: 02/05/2015 15:02    Assessment & Plan:   Diagnoses and all orders for this visit:  Streptococcal sore throat -     POCT rapid strep A  Acute maxillary sinusitis, recurrence not specified  Other orders -     cefUROXime (CEFTIN) 500 MG tablet; Take 1 tablet (500 mg total) by mouth 2 (two) times daily. -     fluconazole (DIFLUCAN) 150 MG tablet; Take 1 tablet (150 mg total) by mouth once.  I am having Kimberly Fernandez start on cefUROXime and fluconazole. I am also having her maintain her Ibuprofen, ALPRAZolam, ondansetron, diphenhydrAMINE, norethindrone, isometheptene-acetaminophen-dichloralphenazone, and sertraline.  Meds ordered this encounter  Medications  . cefUROXime (CEFTIN) 500 MG tablet    Sig: Take 1 tablet (500 mg total) by mouth 2 (two) times daily.    Dispense:  20 tablet    Refill:  0  . fluconazole (DIFLUCAN) 150 MG tablet    Sig: Take 1 tablet (150 mg total) by mouth once.    Dispense:  2 tablet    Refill:  1   Strep (-)  Follow-up: No Follow-up on file.  Sonda Primes, MD

## 2015-06-16 ENCOUNTER — Encounter: Payer: Self-pay | Admitting: Family

## 2015-06-16 ENCOUNTER — Ambulatory Visit (INDEPENDENT_AMBULATORY_CARE_PROVIDER_SITE_OTHER): Payer: BC Managed Care – PPO | Admitting: Family

## 2015-06-16 VITALS — BP 132/82 | HR 67 | Temp 97.9°F | Resp 18 | Ht 64.0 in | Wt 172.0 lb

## 2015-06-16 DIAGNOSIS — Z23 Encounter for immunization: Secondary | ICD-10-CM | POA: Diagnosis not present

## 2015-06-16 DIAGNOSIS — F411 Generalized anxiety disorder: Secondary | ICD-10-CM | POA: Diagnosis not present

## 2015-06-16 MED ORDER — ALPRAZOLAM 0.25 MG PO TABS: 0.2500 mg | ORAL_TABLET | Freq: Two times a day (BID) | ORAL | Status: DC | PRN

## 2015-06-16 MED ORDER — SERTRALINE HCL 50 MG PO TABS
50.0000 mg | ORAL_TABLET | Freq: Every day | ORAL | Status: DC
Start: 2015-06-16 — End: 2017-03-14

## 2015-06-16 NOTE — Progress Notes (Signed)
Pre visit review using our clinic review tool, if applicable. No additional management support is needed unless otherwise documented below in the visit note. 

## 2015-06-16 NOTE — Progress Notes (Signed)
Subjective:    Patient ID: Kimberly Fernandez, female    DOB: 25-Aug-1975, 39 y.o.   MRN: [id number]  Chief Complaint  Patient presents with  . Follow-up    zoloft is working great    HPI:  Kimberly Fernandez is a 39 y.o. female who  has a past medical history of Reflux; Asthma; Albinism (HCC); Nystagmus; Depression; Anxiety; GERD (gastroesophageal reflux disease); and Migraines. and presents today for an office follow up.   1.) Anxiety - Previously started on Zoloft and maintained on Xanax as needed. Takes the medication as prescribed and denies adverse side effects. Notes that her anxiety is very well controlled with the current regimen. She did experiment off the medication for a couple of weeks and notes that she is better with the medication. Not currently using the Xanax as she has run out.   Wt Readings from Last 3 Encounters:  06/16/15 172 lb (78.019 kg)  05/20/15 170 lb (77.111 kg)  02/24/15 164 lb 12.8 oz (74.753 kg)    Allergies  Allergen Reactions  . Doxycycline Rash     Current Outpatient Prescriptions on File Prior to Visit  Medication Sig Dispense Refill  . diphenhydrAMINE (SOMINEX) 25 MG tablet Take 25 mg by mouth at bedtime as needed for sleep (migraines).    . Ibuprofen (ADVIL MIGRAINE) 200 MG CAPS Take 200 mg by mouth every 6 (six) hours as needed (migraines).     . isometheptene-acetaminophen-dichloralphenazone (MIDRIN) 65-100-325 MG capsule Take 2 capsules at the onset of headache and 1 capsule hourly up to 5 capsules in 12 hours. 30 capsule 0  . ondansetron (ZOFRAN) 4 MG tablet Take 1 tablet (4 mg total) by mouth every 8 (eight) hours as needed for nausea or vomiting. 30 tablet 0  . [DISCONTINUED] diphenhydramine-acetaminophen (TYLENOL PM) 25-500 MG TABS Take 1 tablet by mouth at bedtime as needed. Patient used medication for sleep.      No current facility-administered medications on file prior to visit.    Review of Systems  Respiratory: Negative for chest  tightness and shortness of breath.   Cardiovascular: Negative for chest pain, palpitations and leg swelling.  Psychiatric/Behavioral: The patient is not nervous/anxious.       Objective:    BP 132/82 mmHg  Pulse 67  Temp(Src) 97.9 F (36.6 C) (Oral)  Resp 18  Ht 5\' 4"  (1.626 m)  Wt 172 lb (78.019 kg)  BMI 29.51 kg/m2  SpO2 99% Nursing note and vital signs reviewed.  Physical Exam  Constitutional: She is oriented to person, place, and time. She appears well-developed and well-nourished. No distress.  Cardiovascular: Normal rate, regular rhythm, normal heart sounds and intact distal pulses.   Pulmonary/Chest: Effort normal and breath sounds normal.  Neurological: She is alert and oriented to person, place, and time.  Skin: Skin is warm and dry.  Psychiatric: She has a normal mood and affect. Her behavior is normal. Judgment and thought content normal. Her mood appears not anxious. She does not exhibit a depressed mood.       Assessment & Plan:   Problem List Items Addressed This Visit      Other   Anxiety state - Primary    Anxiety and mood are currently stable with Zoloft and Xanax. Denies adverse side effects or suicidal ideations. Continue current dosage of Xanax and Zoloft.  Follow up in 6 months or sooner if needed.       Relevant Medications   ALPRAZolam (XANAX) 0.25 MG tablet  sertraline (ZOLOFT) 50 MG tablet    Other Visit Diagnoses    Encounter for immunization

## 2015-06-16 NOTE — Patient Instructions (Signed)
Thank you for choosing [facility].  Summary/Instructions:  Your prescription(s) have been submitted to your pharmacy or been printed and provided for you. Please take as directed and contact our office if you believe you are having problem(s) with the medication(s) or have any questions.  If your symptoms worsen or fail to improve, please contact our office for further instruction, or in case of emergency go directly to the emergency room at the closest medical facility.     

## 2015-06-16 NOTE — Assessment & Plan Note (Signed)
Anxiety and mood are currently stable with Zoloft and Xanax. Denies adverse side effects or suicidal ideations. Continue current dosage of Xanax and Zoloft.  Follow up in 6 months or sooner if needed.

## 2015-10-08 ENCOUNTER — Encounter: Payer: Self-pay | Admitting: Family

## 2015-10-08 ENCOUNTER — Ambulatory Visit (INDEPENDENT_AMBULATORY_CARE_PROVIDER_SITE_OTHER): Payer: BC Managed Care – PPO | Admitting: Family

## 2015-10-08 DIAGNOSIS — Z0000 Encounter for general adult medical examination without abnormal findings: Secondary | ICD-10-CM | POA: Insufficient documentation

## 2015-10-08 DIAGNOSIS — Z0001 Encounter for general adult medical examination with abnormal findings: Secondary | ICD-10-CM | POA: Insufficient documentation

## 2015-10-08 DIAGNOSIS — R6889 Other general symptoms and signs: Secondary | ICD-10-CM | POA: Diagnosis not present

## 2015-10-08 MED ORDER — PREDNISONE 20 MG PO TABS: 20.0000 mg | ORAL_TABLET | Freq: Every day | ORAL | Status: DC

## 2015-10-08 MED ORDER — AMOXICILLIN-POT CLAVULANATE 875-125 MG PO TABS: 1.0000 | ORAL_TABLET | Freq: Two times a day (BID) | ORAL | Status: DC

## 2015-10-08 NOTE — Patient Instructions (Signed)
Thank you for choosing [facility].  Summary/Instructions:  Your prescription(s) have been submitted to your pharmacy or been printed and provided for you. Please take as directed and contact our office if you believe you are having problem(s) with the medication(s) or have any questions.  Please stop by the lab on the basement level of the building for your blood work. Your results will be released to MyChart (or called to you) after review, usually within 72 hours after test completion. If any changes need to be made, you will be notified at that same time.  If your symptoms worsen or fail to improve, please contact our office for further instruction, or in case of emergency go directly to the emergency room at the closest medical facility.   Health Maintenance, Female Adopting a healthy lifestyle and getting preventive care can go a long way to promote health and wellness. Talk with your health care provider about what schedule of regular examinations is right for you. This is a good chance for you to check in with your provider about disease prevention and staying healthy. In between checkups, there are plenty of things you can do on your own. Experts have done a lot of research about which lifestyle changes and preventive measures are most likely to keep you healthy. Ask your health care provider for more information. WEIGHT AND DIET  Eat a healthy diet  Be sure to include plenty of vegetables, fruits, low-fat dairy products, and lean protein.  Do not eat a lot of foods high in solid fats, added sugars, or salt.  Get regular exercise. This is one of the most important things you can do for your health.  Most adults should exercise for at least 150 minutes each week. The exercise should increase your heart rate and make you sweat (moderate-intensity exercise).  Most adults should also do strengthening exercises at least twice a week. This is in addition to the moderate-intensity  exercise.  Maintain a healthy weight  Body mass index (BMI) is a measurement that can be used to identify possible weight problems. It estimates body fat based on height and weight. Your health care provider can help determine your BMI and help you achieve or maintain a healthy weight.  For females 20 years of age and older:   A BMI below 18.5 is considered underweight.  A BMI of 18.5 to 24.9 is normal.  A BMI of 25 to 29.9 is considered overweight.  A BMI of 30 and above is considered obese.  Watch levels of cholesterol and blood lipids  You should start having your blood tested for lipids and cholesterol at 38 years of age, then have this test every 5 years.  You may need to have your cholesterol levels checked more often if:  Your lipid or cholesterol levels are high.  You are older than 38 years of age.  You are at high risk for heart disease.  CANCER SCREENING   Lung Cancer  Lung cancer screening is recommended for adults 55-80 years old who are at high risk for lung cancer because of a history of smoking.  A yearly low-dose CT scan of the lungs is recommended for people who:  Currently smoke.  Have quit within the past 15 years.  Have at least a 30-pack-year history of smoking. A pack year is smoking an average of one pack of cigarettes a day for 1 year.  Yearly screening should continue until it has been 15 years since you quit.    Yearly screening should stop if you develop a health problem that would prevent you from having lung cancer treatment.  Breast Cancer  Practice breast self-awareness. This means understanding how your breasts normally appear and feel.  It also means doing regular breast self-exams. Let your health care provider know about any changes, no matter how small.  If you are in your 20s or 30s, you should have a clinical breast exam (CBE) by a health care provider every 1-3 years as part of a regular health exam.  If you are 33 or  older, have a CBE every year. Also consider having a breast X-ray (mammogram) every year.  If you have a family history of breast cancer, talk to your health care provider about genetic screening.  If you are at high risk for breast cancer, talk to your health care provider about having an MRI and a mammogram every year.  Breast cancer gene (BRCA) assessment is recommended for women who have family members with BRCA-related cancers. BRCA-related cancers include:  Breast.  Ovarian.  Tubal.  Peritoneal cancers.  Results of the assessment will determine the need for genetic counseling and BRCA1 and BRCA2 testing. Cervical Cancer Your health care provider may recommend that you be screened regularly for cancer of the pelvic organs (ovaries, uterus, and vagina). This screening involves a pelvic examination, including checking for microscopic changes to the surface of your cervix (Pap test). You may be encouraged to have this screening done every 3 years, beginning at age 33.  For women ages 40-65, health care providers may recommend pelvic exams and Pap testing every 3 years, or they may recommend the Pap and pelvic exam, combined with testing for human papilloma virus (HPV), every 5 years. Some types of HPV increase your risk of cervical cancer. Testing for HPV may also be done on women of any age with unclear Pap test results.  Other health care providers may not recommend any screening for nonpregnant women who are considered low risk for pelvic cancer and who do not have symptoms. Ask your health care provider if a screening pelvic exam is right for you.  If you have had past treatment for cervical cancer or a condition that could lead to cancer, you need Pap tests and screening for cancer for at least 20 years after your treatment. If Pap tests have been discontinued, your risk factors (such as having a new sexual partner) need to be reassessed to determine if screening should resume. Some  women have medical problems that increase the chance of getting cervical cancer. In these cases, your health care provider may recommend more frequent screening and Pap tests. Colorectal Cancer  This type of cancer can be detected and often prevented.  Routine colorectal cancer screening usually begins at 38 years of age and continues through 38 years of age.  Your health care provider may recommend screening at an earlier age if you have risk factors for colon cancer.  Your health care provider may also recommend using home test kits to check for hidden blood in the stool.  A small camera at the end of a tube can be used to examine your colon directly (sigmoidoscopy or colonoscopy). This is done to check for the earliest forms of colorectal cancer.  Routine screening usually begins at age 15.  Direct examination of the colon should be repeated every 5-10 years through 38 years of age. However, you may need to be screened more often if early forms of precancerous polyps or  small growths are found. Skin Cancer  Check your skin from head to toe regularly.  Tell your health care provider about any new moles or changes in moles, especially if there is a change in a mole's shape or color.  Also tell your health care provider if you have a mole that is larger than the size of a pencil eraser.  Always use sunscreen. Apply sunscreen liberally and repeatedly throughout the day.  Protect yourself by wearing long sleeves, pants, a wide-brimmed hat, and sunglasses whenever you are outside. HEART DISEASE, DIABETES, AND HIGH BLOOD PRESSURE   High blood pressure causes heart disease and increases the risk of stroke. High blood pressure is more likely to develop in:  People who have blood pressure in the high end of the normal range (130-139/85-89 mm Hg).  People who are overweight or obese.  People who are African American.  If you are 39-71 years of age, have your blood pressure checked every  3-5 years. If you are 3 years of age or older, have your blood pressure checked every year. You should have your blood pressure measured twice--once when you are at a hospital or clinic, and once when you are not at a hospital or clinic. Record the average of the two measurements. To check your blood pressure when you are not at a hospital or clinic, you can use:  An automated blood pressure machine at a pharmacy.  A home blood pressure monitor.  If you are between 53 years and 35 years old, ask your health care provider if you should take aspirin to prevent strokes.  Have regular diabetes screenings. This involves taking a blood sample to check your fasting blood sugar level.  If you are at a normal weight and have a low risk for diabetes, have this test once every three years after 38 years of age.  If you are overweight and have a high risk for diabetes, consider being tested at a younger age or more often. PREVENTING INFECTION  Hepatitis B  If you have a higher risk for hepatitis B, you should be screened for this virus. You are considered at high risk for hepatitis B if:  You were born in a country where hepatitis B is common. Ask your health care provider which countries are considered high risk.  Your parents were born in a high-risk country, and you have not been immunized against hepatitis B (hepatitis B vaccine).  You have HIV or AIDS.  You use needles to inject street drugs.  You live with someone who has hepatitis B.  You have had sex with someone who has hepatitis B.  You get hemodialysis treatment.  You take certain medicines for conditions, including cancer, organ transplantation, and autoimmune conditions. Hepatitis C  Blood testing is recommended for:  Everyone born from [birth year] through 1963.  Anyone with known risk factors for hepatitis C. Sexually transmitted infections (STIs)  You should be screened for sexually transmitted infections (STIs) including  gonorrhea and chlamydia if:  You are sexually active and are younger than 38 years of age.  You are older than 38 years of age and your health care provider tells you that you are at risk for this type of infection.  Your sexual activity has changed since you were last screened and you are at an increased risk for chlamydia or gonorrhea. Ask your health care provider if you are at risk.  If you do not have HIV, but are at risk, it may be  recommended that you take a prescription medicine daily to prevent HIV infection. This is called pre-exposure prophylaxis (PrEP). You are considered at risk if:  You are sexually active and do not regularly use condoms or know the HIV status of your partner(s).  You take drugs by injection.  You are sexually active with a partner who has HIV. Talk with your health care provider about whether you are at high risk of being infected with HIV. If you choose to begin PrEP, you should first be tested for HIV. You should then be tested every 3 months for as long as you are taking PrEP.  PREGNANCY   If you are premenopausal and you may become pregnant, ask your health care provider about preconception counseling.  If you may become pregnant, take 400 to 800 micrograms (mcg) of folic acid every day.  If you want to prevent pregnancy, talk to your health care provider about birth control (contraception). OSTEOPOROSIS AND MENOPAUSE   Osteoporosis is a disease in which the bones lose minerals and strength with aging. This can result in serious bone fractures. Your risk for osteoporosis can be identified using a bone density scan.  If you are 90 years of age or older, or if you are at risk for osteoporosis and fractures, ask your health care provider if you should be screened.  Ask your health care provider whether you should take a calcium or vitamin D supplement to lower your risk for osteoporosis.  Menopause may have certain physical symptoms and  risks.  Hormone replacement therapy may reduce some of these symptoms and risks. Talk to your health care provider about whether hormone replacement therapy is right for you.  HOME CARE INSTRUCTIONS   Schedule regular health, dental, and eye exams.  Stay current with your immunizations.   Do not use any tobacco products including cigarettes, chewing tobacco, or electronic cigarettes.  If you are pregnant, do not drink alcohol.  If you are breastfeeding, limit how much and how often you drink alcohol.  Limit alcohol intake to no more than 1 drink per day for nonpregnant women. One drink equals 12 ounces of beer, 5 ounces of wine, or 1 ounces of hard liquor.  Do not use street drugs.  Do not share needles.  Ask your health care provider for help if you need support or information about quitting drugs.  Tell your health care provider if you often feel depressed.  Tell your health care provider if you have ever been abused or do not feel safe at home.   This information is not intended to replace advice given to you by your health care provider. Make sure you discuss any questions you have with your health care provider.   Document Released: 02/14/2011 Document Revised: 08/22/2014 Document Reviewed: 07/03/2013 Elsevier Interactive Patient Education [employer].

## 2015-10-08 NOTE — Assessment & Plan Note (Addendum)
1) Anticipatory Guidance: Discussed importance of wearing a seatbelt while driving and not texting while driving; changing batteries in smoke detector at least once annually; wearing suntan lotion when outside; eating a balanced and moderate diet; getting physical activity at least 30 minutes per day.  2) Immunizations / Screenings / Labs:  All immunizations are up to date per recommendations. Due for a dental and vision screen encouraged to be completed independently. All other screenings are up to date per recommendations. Obtain CBC, BMET, Lipid profile and TSH.  Overall well exam with risk factors for cardiovascular disease including obesity. Encouraged to track calories through fitness software to ensure adequate calorie intake. Focus on nutrient dense foods and an intake that is moderate, varied, and balanced. Continue physical activity. Goal weight loss of 5-10% of current body weight. Sinus pressure and headache noted. Start prednisone and augmentin if needed. Follow up prevention exam in 1 year and office visit if needed pending blood work.

## 2015-10-08 NOTE — Progress Notes (Signed)
Pre visit review using our clinic review tool, if applicable. No additional management support is needed unless otherwise documented below in the visit note. 

## 2015-10-08 NOTE — Progress Notes (Signed)
Subjective:    Patient ID: Kimberly Fernandez, female    DOB: October 05, 1975, 40 y.o.   MRN: [id number]  Chief Complaint  Patient presents with  . CPE    not fasting    HPI:  Kimberly Fernandez is a 40 y.o. female who presents today for an annual wellness visit.   1) Health Maintenance -   Diet - Averaging about 3 meals per day with light snacks consisting of fruits, eggs, chicken, occasional vegetables; Caffeine intake 3-4 cups daily  Exercise - 45 minutes about 5 times per week.    2) Preventative Exams / Immunizations:  Dental -- Due for exam  Vision -- Due for exam   Health Maintenance  Topic Date Due  . INFLUENZA VACCINE  03/15/2016  . PAP SMEAR  05/15/2017  . TETANUS/TDAP  02/25/2021  . HIV Screening  Completed    Immunization History  Administered Date(s) Administered  . Influenza,inj,Quad PF,36+ Mos 06/16/2015  . Tdap 02/26/2011    Allergies  Allergen Reactions  . Doxycycline Rash     Outpatient Prescriptions Prior to Visit  Medication Sig Dispense Refill  . ALPRAZolam (XANAX) 0.25 MG tablet Take 1 tablet (0.25 mg total) by mouth 2 (two) times daily as needed for anxiety. 20 tablet 0  . Ibuprofen (ADVIL MIGRAINE) 200 MG CAPS Take 200 mg by mouth every 6 (six) hours as needed (migraines).     . sertraline (ZOLOFT) 50 MG tablet Take 1 tablet (50 mg total) by mouth daily. 90 tablet 3  . diphenhydrAMINE (SOMINEX) 25 MG tablet Take 25 mg by mouth at bedtime as needed for sleep (migraines).    . isometheptene-acetaminophen-dichloralphenazone (MIDRIN) 65-100-325 MG capsule Take 2 capsules at the onset of headache and 1 capsule hourly up to 5 capsules in 12 hours. 30 capsule 0  . ondansetron (ZOFRAN) 4 MG tablet Take 1 tablet (4 mg total) by mouth every 8 (eight) hours as needed for nausea or vomiting. 30 tablet 0   No facility-administered medications prior to visit.     Past Medical History  Diagnosis Date  . Reflux   . Asthma   . Albinism (HCC)   . Nystagmus    . Depression   . Anxiety   . GERD (gastroesophageal reflux disease)   . Migraines      Past Surgical History  Procedure Laterality Date  . Tooth extraction    . Upper gastrointestinal endoscopy    . Eye surgery       Family History  Problem Relation Age of Onset  . Hypertension Father   . Cancer Father     lung  . Heart disease Maternal Grandmother   . Cancer Maternal Grandfather     lung  . Migraines Mother   . Migraines Son   . Diabetes Maternal Aunt   . Diabetes Maternal Uncle      Social History   Social History  . Marital Status: Married    Spouse Name: N/A  . Number of Children: 3  . Years of Education: 18   Occupational History  . Lab Manager    Social History Main Topics  . Smoking status: Never Smoker   . Smokeless tobacco: Never Used  . Alcohol Use: 0.0 oz/week    0 Standard drinks or equivalent per week     Comment: occasional  . Drug Use: No  . Sexual Activity: Yes    Birth Control/ Protection: None     Comment: NFP   Other Topics Concern  .  Not on file   Social History Narrative   Born and raised in [place], [place]. Fun: run, crochet   Denies religious beliefs that would effect health care.     Review of Systems  Constitutional: Denies fever, chills, fatigue, or significant weight gain/loss. HENT: Head: Denies headache or neck pain Positive for sinus headache.  Ears: Denies changes in hearing, ringing in ears, earache, drainage Nose: Denies discharge, stuffiness, itching, nosebleed, sinus pain Throat: Denies sore throat, hoarseness, dry mouth, sores, thrush Eyes: Denies loss/changes in vision, pain, redness, blurry/double vision, flashing lights Cardiovascular: Denies chest pain/discomfort, tightness, palpitations, shortness of breath with activity, difficulty lying down, swelling, sudden awakening with shortness of breath Respiratory: Denies shortness of breath, cough, sputum production, wheezing Gastrointestinal: Denies dysphasia,  heartburn, change in appetite, nausea, change in bowel habits, rectal bleeding, constipation, diarrhea, yellow skin or eyes Genitourinary: Denies frequency, urgency, burning/pain, blood in urine, incontinence, change in urinary strength. Musculoskeletal: Denies muscle/joint pain, stiffness, back pain, redness or swelling of joints, trauma Skin: Denies rashes, lumps, itching, dryness, color changes, or hair/nail changes Neurological: Denies dizziness, fainting, seizures, weakness, numbness, tingling, tremor Psychiatric - Denies nervousness, stress, depression or memory loss Endocrine: Denies heat or cold intolerance, sweating, frequent urination, excessive thirst, changes in appetite Hematologic: Denies ease of bruising or bleeding     Objective:     BP 124/86 mmHg  Pulse 65  Temp(Src) 98.1 F (36.7 C) (Oral)  Resp 16  Ht  (1.626 m)  Wt 177 lb 12.8 oz (80.65 kg)  BMI 30.50 kg/m2  SpO2 97% Nursing note and vital signs reviewed.  Physical Exam  Constitutional: She is oriented to person, place, and time. She appears well-developed and well-nourished.  HENT:  Head: Normocephalic.  Right Ear: Hearing, tympanic membrane, external ear and ear canal normal.  Left Ear: Hearing, tympanic membrane, external ear and ear canal normal.  Nose: Right sinus exhibits maxillary sinus tenderness. Left sinus exhibits maxillary sinus tenderness.  Mouth/Throat: Uvula is midline, oropharynx is clear and moist and mucous membranes are normal.  Eyes: Conjunctivae and EOM are normal. Pupils are equal, round, and reactive to light.  Neck: Neck supple. No JVD present. No tracheal deviation present. No thyromegaly present.  Cardiovascular: Normal rate, regular rhythm, normal heart sounds and intact distal pulses.   Pulmonary/Chest: Effort normal and breath sounds normal.  Abdominal: Soft. Bowel sounds are normal. She exhibits no distension and no mass. There is no tenderness. There is no rebound and no  guarding.  Musculoskeletal: Normal range of motion. She exhibits no edema or tenderness.  Lymphadenopathy:    She has no cervical adenopathy.  Neurological: She is alert and oriented to person, place, and time. She has normal reflexes. No cranial nerve deficit. She exhibits normal muscle tone. Coordination normal.  Skin: Skin is warm and dry.  Psychiatric: She has a normal mood and affect. Her behavior is normal. Judgment and thought content normal.       Assessment & Plan:   Problem List Items Addressed This Visit      Other   Encounter for general adult medical examination with abnormal findings - Primary    1) Anticipatory Guidance: Discussed importance of wearing a seatbelt while driving and not texting while driving; changing batteries in smoke detector at least once annually; wearing suntan lotion when outside; eating a balanced and moderate diet; getting physical activity at least 30 minutes per day.  2) Immunizations / Screenings / Labs:  All immunizations are up to  date per recommendations. Due for a dental and vision screen encouraged to be completed independently. All other screenings are up to date per recommendations. Obtain CBC, BMET, Lipid profile and TSH.  Overall well exam with risk factors for cardiovascular disease including obesity. Encouraged to track calories through fitness software to ensure adequate calorie intake. Focus on nutrient dense foods and an intake that is moderate, varied, and balanced. Continue physical activity. Goal weight loss of 5-10% of current body weight. Sinus pressure and headache noted. Start prednisone and augmentin if needed. Follow up prevention exam in 1 year and office visit if needed pending blood work.

## 2015-10-13 ENCOUNTER — Other Ambulatory Visit (INDEPENDENT_AMBULATORY_CARE_PROVIDER_SITE_OTHER): Payer: BC Managed Care – PPO

## 2015-10-13 ENCOUNTER — Encounter: Payer: Self-pay | Admitting: Family

## 2015-10-13 DIAGNOSIS — Z0001 Encounter for general adult medical examination with abnormal findings: Secondary | ICD-10-CM

## 2015-10-13 DIAGNOSIS — R6889 Other general symptoms and signs: Secondary | ICD-10-CM | POA: Diagnosis not present

## 2015-10-13 LAB — COMPREHENSIVE METABOLIC PANEL
ALBUMIN: 4.3 g/dL (ref 3.5–5.2)
ALK PHOS: 63 U/L (ref 39–117)
ALT: 11 U/L (ref 0–35)
AST: 15 U/L (ref 0–37)
BUN: 10 mg/dL (ref 6–23)
CO2: 30 meq/L (ref 19–32)
Calcium: 9.6 mg/dL (ref 8.4–10.5)
Chloride: 104 mEq/L (ref 96–112)
Creatinine, Ser: 0.67 mg/dL (ref 0.40–1.20)
GFR: 104.86 mL/min (ref >60.00)
Glucose, Bld: 91 mg/dL (ref 70–99)
POTASSIUM: 4 meq/L (ref 3.5–5.1)
Sodium: 141 mEq/L (ref 135–145)
TOTAL PROTEIN: 7.3 g/dL (ref 6.0–8.3)
Total Bilirubin: 0.7 mg/dL (ref 0.2–1.2)

## 2015-10-13 LAB — LIPID PANEL
Cholesterol: 155 mg/dL (ref 0–200)
HDL: 45.7 mg/dL (ref >39.00)
LDL CALC: 98 mg/dL (ref 0–99)
NonHDL: 109.08
TRIGLYCERIDES: 55 mg/dL (ref 0.0–149.0)
Total CHOL/HDL Ratio: 3
VLDL: 11 mg/dL (ref 0.0–40.0)

## 2015-10-13 LAB — CBC
HCT: 39.8 % (ref 36.0–46.0)
HEMOGLOBIN: 13.7 g/dL (ref 12.0–15.0)
MCHC: 34.4 g/dL (ref 30.0–36.0)
MCV: 85.1 fl (ref 78.0–100.0)
PLATELETS: 208 10*3/uL (ref 150.0–400.0)
RBC: 4.67 Mil/uL (ref 3.87–5.11)
RDW: 13 % (ref 11.5–15.5)
WBC: 9.4 10*3/uL (ref 4.0–10.5)

## 2015-10-13 LAB — TSH: TSH: 0.73 u[IU]/mL (ref 0.35–4.50)

## 2015-12-04 ENCOUNTER — Ambulatory Visit: Payer: BC Managed Care – PPO | Admitting: Family

## 2016-01-03 DIAGNOSIS — Z9289 Personal history of other medical treatment: Secondary | ICD-10-CM | POA: Diagnosis not present

## 2016-01-05 ENCOUNTER — Encounter: Payer: Self-pay | Admitting: Internal Medicine

## 2016-01-05 ENCOUNTER — Ambulatory Visit (INDEPENDENT_AMBULATORY_CARE_PROVIDER_SITE_OTHER): Payer: BC Managed Care – PPO | Admitting: Internal Medicine

## 2016-01-05 VITALS — BP 130/76 | HR 73 | Temp 98.4°F | Resp 20 | Wt 185.0 lb

## 2016-01-05 DIAGNOSIS — J029 Acute pharyngitis, unspecified: Secondary | ICD-10-CM

## 2016-01-05 MED ORDER — AZITHROMYCIN 250 MG PO TABS: ORAL_TABLET | ORAL | Status: DC

## 2016-01-05 MED ORDER — HYDROCODONE-HOMATROPINE 5-1.5 MG/5ML PO SYRP: 5.0000 mL | ORAL_SOLUTION | Freq: Four times a day (QID) | ORAL | Status: DC | PRN

## 2016-01-05 NOTE — Progress Notes (Signed)
Pre visit review using our clinic review tool, if applicable. No additional management support is needed unless otherwise documented below in the visit note. 

## 2016-01-05 NOTE — Patient Instructions (Signed)
Please take all new medication as prescribed - the antibiotic, and cough med if needed ° °Please continue all other medications as before, and refills have been done if requested. ° °Please have the pharmacy call with any other refills you may need. ° °Please keep your appointments with your specialists as you may have planned ° ° ° ° °

## 2016-01-05 NOTE — Progress Notes (Signed)
   Subjective:    Patient ID: Kimberly Fernandez, female    DOB: April 28, 1976, 40 y.o.   MRN: [id number]  HPI   Here with 2-3 days acute onset fever, severe ST pain, pressure, headache, general weakness and malaise, and mild non prod cough, but pt denies chest pain, wheezing, increased sob or doe, orthopnea, PND, increased LE swelling, palpitations, dizziness or syncope.  Pt denies new neurological symptoms such as new headache, or facial or extremity weakness or numbness   Pt denies polydipsia, polyuria. Past Medical History  Diagnosis Date  . Reflux   . Asthma   . Albinism (HCC)   . Nystagmus   . Depression   . Anxiety   . GERD (gastroesophageal reflux disease)   . Migraines    Past Surgical History  Procedure Laterality Date  . Tooth extraction    . Upper gastrointestinal endoscopy    . Eye surgery      reports that she has never smoked. She has never used smokeless tobacco. She reports that she drinks alcohol. She reports that she does not use illicit drugs. family history includes Cancer in her father and maternal grandfather; Diabetes in her maternal aunt and maternal uncle; Heart disease in her maternal grandmother; Hypertension in her father; Migraines in her mother and son. Allergies  Allergen Reactions  . Doxycycline Rash   Current Outpatient Prescriptions on File Prior to Visit  Medication Sig Dispense Refill  . ALPRAZolam (XANAX) 0.25 MG tablet Take 1 tablet (0.25 mg total) by mouth 2 (two) times daily as needed for anxiety. 20 tablet 0  . Ibuprofen (ADVIL MIGRAINE) 200 MG CAPS Take 200 mg by mouth every 6 (six) hours as needed (migraines).     . sertraline (ZOLOFT) 50 MG tablet Take 1 tablet (50 mg total) by mouth daily. 90 tablet 3  . [DISCONTINUED] diphenhydramine-acetaminophen (TYLENOL PM) 25-500 MG TABS Take 1 tablet by mouth at bedtime as needed. Patient used medication for sleep.      No current facility-administered medications on file prior to visit.   Review of  Systems  All otherwise neg per pt     Objective:   Physical Exam BP 130/76 mmHg  Pulse 73  Temp(Src) 98.4 F (36.9 C) (Oral)  Resp 20  Wt 185 lb (83.915 kg)  SpO2 97% VS noted, mild ill Constitutional: Pt appears in no apparent distress HENT: Head: NCAT.  Right Ear: External ear normal.  Left Ear: External ear normal.  Bilat tm's with mild erythema.  Max sinus areas non tender.  Pharynx with severe erythema, 1+ exudate Eyes: . Pupils are equal, round, and reactive to light. Conjunctivae and EOM are normal Neck: Normal range of motion. Neck supple. with bilat submandib LA tender Cardiovascular: Normal rate and regular rhythm.   Pulmonary/Chest: Effort normal and breath sounds without rales or wheezing.  Neurological: Pt is alert. Not confused , motor grossly intact Skin: Skin is warm. No rash, no LE edema Psychiatric: Pt behavior is normal. No agitation.     Assessment & Plan:

## 2016-01-07 ENCOUNTER — Ambulatory Visit (INDEPENDENT_AMBULATORY_CARE_PROVIDER_SITE_OTHER): Payer: BC Managed Care – PPO | Admitting: Family

## 2016-01-07 VITALS — BP 120/80 | HR 57 | Temp 98.3°F | Ht 64.0 in | Wt 179.5 lb

## 2016-01-07 DIAGNOSIS — G43909 Migraine, unspecified, not intractable, without status migrainosus: Secondary | ICD-10-CM | POA: Diagnosis not present

## 2016-01-07 MED ORDER — PROMETHAZINE HCL 25 MG/ML IJ SOLN
12.5000 mg | Freq: Once | INTRAMUSCULAR | Status: AC
  Administered 2016-01-07: 12.5 mg via INTRAMUSCULAR

## 2016-01-07 MED ORDER — KETOROLAC TROMETHAMINE 60 MG/2ML IM SOLN
60.0000 mg | Freq: Once | INTRAMUSCULAR | Status: AC
  Administered 2016-01-07: 60 mg via INTRAMUSCULAR

## 2016-01-07 NOTE — Patient Instructions (Signed)
Suspect you're having a migraine however it is bilateral this time. I'm very reassured by her normal neurologic exam. You also have a fairly recent MRI which is reassuring.  If your pain does not improve today, please go to the emergency room for further evaluation as you may need imaging.   If there is no improvement in your symptoms, or if there is any worsening of symptoms, or if you have any additional concerns, please return for re-evaluation; or, if we are closed, consider going to the Emergency Room for evaluation if symptoms urgent.  General Headache Without Cause A headache is pain or discomfort felt around the head or neck area. The specific cause of a headache may not be found. There are many causes and types of headaches. A few common ones are:  Tension headaches.  Migraine headaches.  Cluster headaches.  Chronic daily headaches. HOME CARE INSTRUCTIONS  Watch your condition for any changes. Take these steps to help with your condition: Managing Pain  Take over-the-counter and prescription medicines only as told by your health care provider.  Lie down in a dark, quiet room when you have a headache.  If directed, apply ice to the head and neck area:  Put ice in a plastic bag.  Place a towel between your skin and the bag.  Leave the ice on for 20 minutes, 2-3 times per day.  Use a heating pad or hot shower to apply heat to the head and neck area as told by your health care provider.  Keep lights dim if bright lights bother you or make your headaches worse. Eating and Drinking  Eat meals on a regular schedule.  Limit alcohol use.  Decrease the amount of caffeine you drink, or stop drinking caffeine. General Instructions  Keep all follow-up visits as told by your health care provider. This is important.  Keep a headache journal to help find out what may trigger your headaches. For example, write down:  What you eat and drink.  How much sleep you get.  Any  change to your diet or medicines.  Try massage or other relaxation techniques.  Limit stress.  Sit up straight, and do not tense your muscles.  Do not use tobacco products, including cigarettes, chewing tobacco, or e-cigarettes. If you need help quitting, ask your health care provider.  Exercise regularly as told by your health care provider.  Sleep on a regular schedule. Get 7-9 hours of sleep, or the amount recommended by your health care provider. SEEK MEDICAL CARE IF:   Your symptoms are not helped by medicine.  You have a headache that is different from the usual headache.  You have nausea or you vomit.  You have a fever. SEEK IMMEDIATE MEDICAL CARE IF:   Your headache becomes severe.  You have repeated vomiting.  You have a stiff neck.  You have a loss of vision.  You have problems with speech.  You have pain in the eye or ear.  You have muscular weakness or loss of muscle control.  You lose your balance or have trouble walking.  You feel faint or pass out.  You have confusion.   This information is not intended to replace advice given to you by your health care provider. Make sure you discuss any questions you have with your health care provider.   Document Released: 08/01/2005 Document Revised: 04/22/2015 Document Reviewed: 11/24/2014 Elsevier Interactive Patient Education [employer]2016 [employer] Inc[employer]

## 2016-01-07 NOTE — Progress Notes (Signed)
Subjective:    Patient ID: Kimberly Fernandez, female    DOB: [birth year], 40 y.o.   MRN: [id number]   Kimberly Fernandez is a 40 y.o. female who presents today for an acute visit.    HPI Comments: Diagnosed with strep and started z pack with relief of sore throat. States not pregnant.   Drinking water, not eating much food due to nausea.  Per chart review, patient seen 02/2015 for migraine which was refractory to Topamax and Imitrex. At that time she was started on Midrin. PCP had discussion of prophylactic treatment headaches continued. Per chart review, patient also been treated with amitriptyline. Patient MR brain 09/2014 for chronic migraines. 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 Otherwise he wrote negative brain MRI.  Seasonal allergies. 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                                                                                                                                                                             Headache  This is a new problem. The current episode started yesterday. The problem occurs constantly. The problem has been unchanged. The pain is located in the frontal region. The pain quality is not similar to prior headaches (normally on one side, temporal). The quality of the pain is described as sharp. The pain is at a severity of 7/10. Associated symptoms include nausea, rhinorrhea, sinus pressure and vomiting. Pertinent negatives include no blurred vision, coughing, dizziness, fever, numbness, phonophobia, photophobia, sore throat, visual change or weakness. Exacerbated by: stress. She has tried acetaminophen and NSAIDs for the symptoms. The treatment provided no relief. Her past medical history is significant for hypertension (during pregnancy) and migraine headaches. There is no history of cancer, recent head traumas or sinus disease.      Past Medical History  Diagnosis Date  . Reflux   .  Asthma   . Albinism (HCC)   . Nystagmus   . Depression   . Anxiety   . GERD (gastroesophageal reflux disease)   . Migraines    Allergies: Doxycycline Current Outpatient Prescriptions on File Prior to Visit  Medication Sig Dispense Refill  . ALPRAZolam (XANAX) 0.25 MG tablet Take 1 tablet (0.25 mg total) by mouth 2 (two) times daily as needed for anxiety. 20 tablet 0  . azithromycin (ZITHROMAX Z-PAK) 250 MG tablet 2 tab by mouth on day 1, then 1 tab for 4 days 6 tablet 1  . HYDROcodone-homatropine (HYCODAN) 5-1.5 MG/5ML syrup Take 5 mLs by mouth every 6 (six) hours as needed for cough. 180 mL 0  . Ibuprofen (ADVIL MIGRAINE) 200 MG CAPS Take 200 mg by mouth every 6 (six) hours as  needed (migraines).     . sertraline (ZOLOFT) 50 MG tablet Take 1 tablet (50 mg total) by mouth daily. 90 tablet 3  . [DISCONTINUED] diphenhydramine-acetaminophen (TYLENOL PM) 25-500 MG TABS Take 1 tablet by mouth at bedtime as needed. Patient used medication for sleep.      No current facility-administered medications on file prior to visit.    Social History  Substance Use Topics  . Smoking status: Never Smoker   . Smokeless tobacco: Never Used  . Alcohol Use: 0.0 oz/week    0 Standard drinks or equivalent per week     Comment: occasional    Review of Systems  Constitutional: Negative for fever and chills.  HENT: Positive for postnasal drip, rhinorrhea and sinus pressure. Negative for sore throat.   Eyes: Negative for blurred vision, photophobia and itching.  Respiratory: Negative for cough.   Cardiovascular: Negative for chest pain and palpitations.  Gastrointestinal: Positive for nausea and vomiting.  Musculoskeletal: Negative for myalgias.  Neurological: Positive for headaches. Negative for dizziness, weakness and numbness.  Psychiatric/Behavioral:       Anxiety      Objective:    BP 120/80 mmHg  Pulse 57  Temp(Src) 98.3 F (36.8 C) (Oral)  Ht 5\' 4"  (1.626 m)  Wt 179 lb 8 oz (81.421 kg)   BMI 30.80 kg/m2  SpO2 97%   Physical Exam  Constitutional: She appears well-developed and well-nourished.  HENT:  Head: Normocephalic and atraumatic.  Right Ear: Hearing, tympanic membrane, external ear and ear canal normal. No drainage, swelling or tenderness. No foreign bodies. Tympanic membrane is not erythematous and not bulging. No middle ear effusion. No decreased hearing is noted.  Left Ear: Hearing, tympanic membrane, external ear and ear canal normal. No drainage, swelling or tenderness. No foreign bodies. Tympanic membrane is not erythematous and not bulging.  No middle ear effusion. No decreased hearing is noted.  Nose: Nose normal. No rhinorrhea. Right sinus exhibits no maxillary sinus tenderness and no frontal sinus tenderness. Left sinus exhibits no maxillary sinus tenderness and no frontal sinus tenderness.  Mouth/Throat: Uvula is midline and mucous membranes are normal. Posterior oropharyngeal erythema present. No oropharyngeal exudate, posterior oropharyngeal edema or tonsillar abscesses.  Eyes: Conjunctivae and lids are normal. Pupils are equal, round, and reactive to light. Lids are everted and swept, no foreign bodies found. Right eye exhibits nystagmus. Left eye exhibits nystagmus.  Constant horizontal nystagmus.  Cardiovascular: Normal rate, regular rhythm, normal heart sounds and normal pulses.   Pulmonary/Chest: Effort normal and breath sounds normal. She has no wheezes. She has no rhonchi. She has no rales.  Lymphadenopathy:       Head (right side): No submental, no submandibular, no tonsillar, no preauricular, no posterior auricular and no occipital adenopathy present.       Head (left side): No submental, no submandibular, no tonsillar, no preauricular, no posterior auricular and no occipital adenopathy present.    She has no cervical adenopathy.       Right cervical: No superficial cervical, no deep cervical and no posterior cervical adenopathy present.      Left  cervical: No superficial cervical, no deep cervical and no posterior cervical adenopathy present.  Neurological: She is alert. She has normal strength. No cranial nerve deficit or sensory deficit. She displays a negative Romberg sign.  Reflex Scores:      Bicep reflexes are 2+ on the right side and 2+ on the left side.      Patellar reflexes are 2+  on the right side and 2+ on the left side. Grip equal and strong bilateral upper extremities. Gait strong and steady. Able to perform  finger-to-nose without difficulty.   Skin: Skin is warm and dry.  Psychiatric: She has a normal mood and affect. Her speech is normal and behavior is normal. Thought content normal.  Vitals reviewed.      Assessment & Plan:   1. Migraine without status migrainosus, not intractable, unspecified migraine type I'm reassured by normal neurologic exam. 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 Very reassured that after Toradol and Phenergan injections, when I checked on the patient 5 minutes later in the room, she was resting, smiling, and stated that she was feeling much better. Ms. Pfeifle and I agreed that if the headache returned, we would pursue an MRI of the head as typically her headaches are unilateral and today her headache as bilateral.  - ketorolac (TORADOL) injection 60 mg; Inject 2 mLs (60 mg total) into the muscle once. - promethazine (PHENERGAN) injection 12.5 mg; Inject 0.5 mLs (12.5 mg total) into the muscle once.    I am having Ms. Haslem maintain her Ibuprofen, ALPRAZolam, sertraline, azithromycin, and HYDROcodone-homatropine. We will continue to administer ketorolac and promethazine.   Meds ordered this encounter  Medications  . ketorolac (TORADOL) injection 60 mg    Sig:   . promethazine (PHENERGAN) injection 12.5 mg    Sig:      Start medications as prescribed and explained to patient on After Visit Summary ( AVS). Risks, benefits, and alternatives of the medications  and treatment plan prescribed today were discussed, and patient expressed understanding.   Education regarding symptom management and diagnosis given to patient.   Follow-up:Plan follow-up and return precautions given if any worsening symptoms or change in condition.   Continue to follow with Jeanine Luz, FNP for routine health maintenance.   [name] and I agreed with plan.   Rennie Plowman, FNP

## 2016-01-07 NOTE — Progress Notes (Signed)
Pre visit review using our clinic review tool, if applicable. No additional management support is needed unless otherwise documented below in the visit note. 

## 2016-01-11 NOTE — Assessment & Plan Note (Signed)
Mild to mod, for antibx course,  to f/u any worsening symptoms or concerns 

## 2016-02-06 IMAGING — MR MR HEAD W/O CM
10 series · 47 of 48 positions shown · non-contrast
Comparison: 11/23/2003

CLINICAL DATA: Chronic migraines with severe headache and
nystagmus.

EXAM:
MRI HEAD WITHOUT CONTRAST
TECHNIQUE: Multiplanar, multiecho pulse sequences of the brain and surrounding
structures were obtained without intravenous contrast.

[Series 2: t1_se_sag · sagittal · 5.0mm · 0.45mm/px · 3 of 21 slices shown]
[im 1/21]
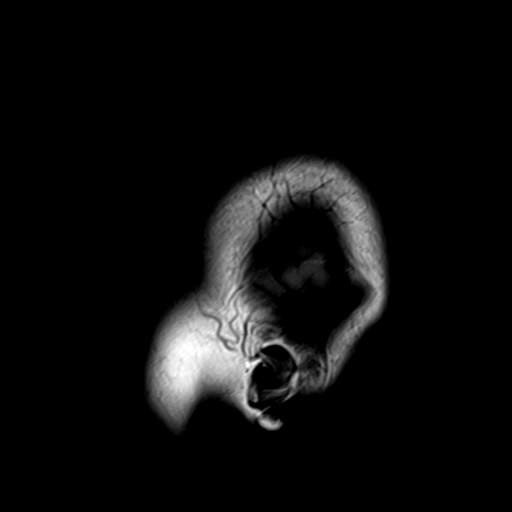
[im 11/21]
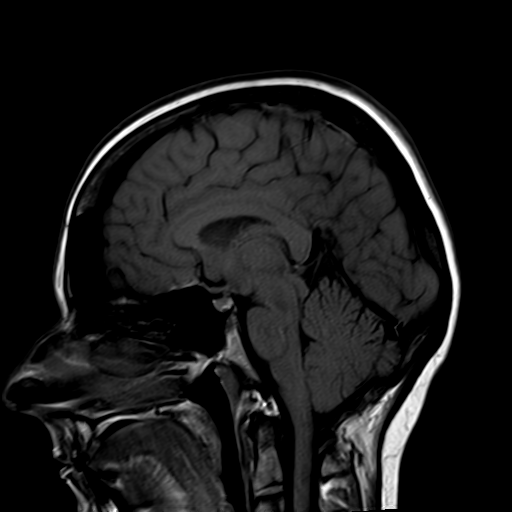
[im 21/21]
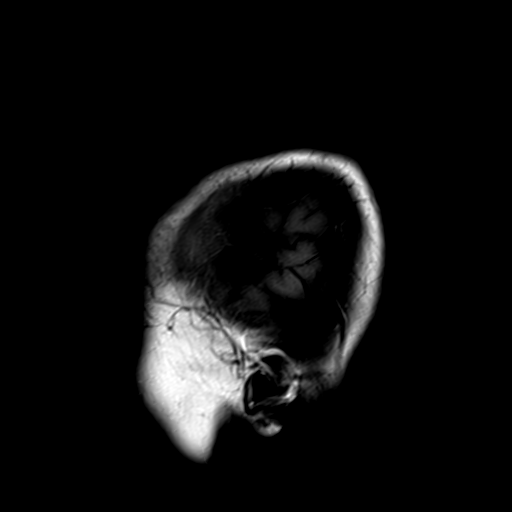

[Series 3: ep2d_diff_(id)_trace · axial · 3.0mm · 1.80mm/px · z∈[-43,+104]mm · 9 of 99 slices shown]
[im 1/99]
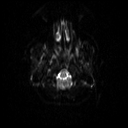
[im 13/99]
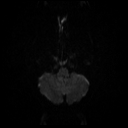
[im 25/99]
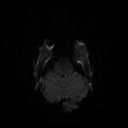
[im 37/99]
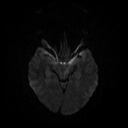
[im 50/99]
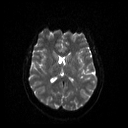
[im 62/99]
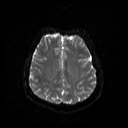
[im 74/99]
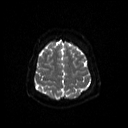
[im 86/99]
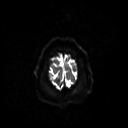
[im 99/99]
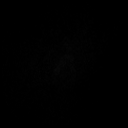

[Series 4: ep2d_diff_(id)_trace_adc · axial · 3.0mm · 1.80mm/px · z∈[-43,+104]mm · 5 of 50 slices shown]
[im 1/50]
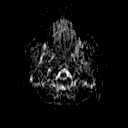
[im 13/50]
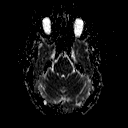
[im 25/50]
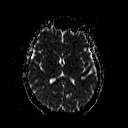
[im 37/50]
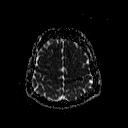
[im 50/50]
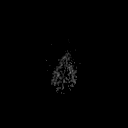

[Series 6: swi_images · axial · 2.0mm · 0.90mm/px · z∈[-50,+108]mm · 8 of 80 slices shown]
[im 1/80]
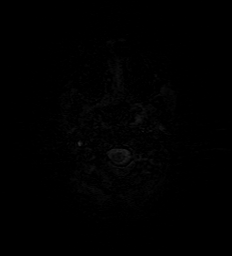
[im 12/80]
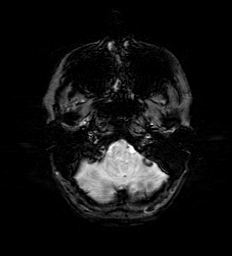
[im 23/80]
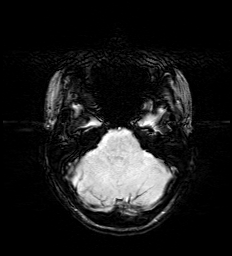
[im 34/80]
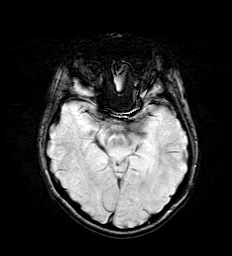
[im 46/80]
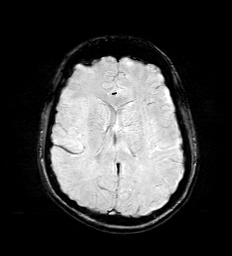
[im 57/80]
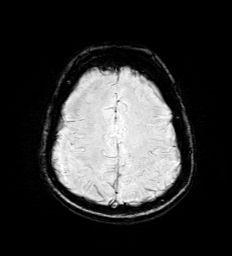
[im 68/80]
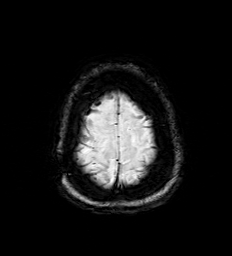
[im 80/80]
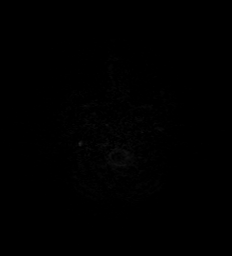

[Series 7: ep2d_diff cor for · coronal · 5.0mm · 0.90mm/px · 6 of 68 slices shown (1 of 2)]
[im 1/68]
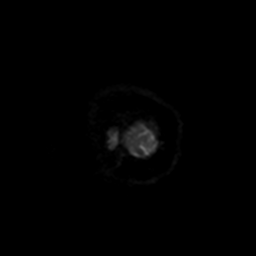
[im 14/68]
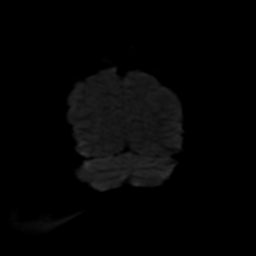
[im 27/68]
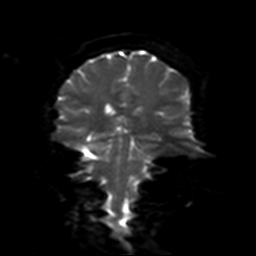
[im 41/68]
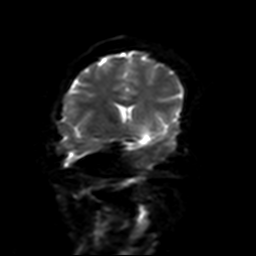
[im 54/68]
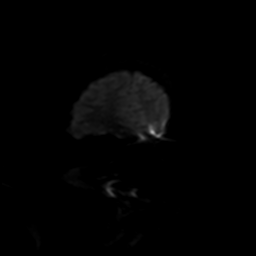
[im 68/68]
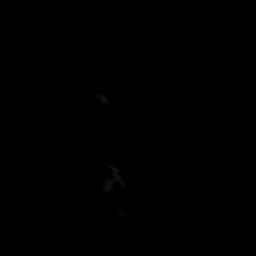

[Series 8: ep2d_diff cor for · coronal · 5.0mm · 0.90mm/px · 3 of 34 slices shown (2 of 2)]
[im 1/34]
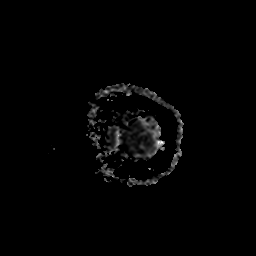
[im 17/34]
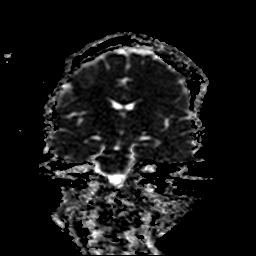
[im 34/34]
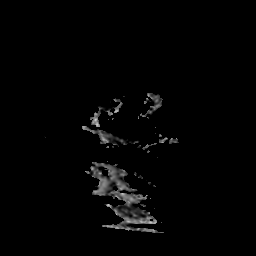

[Series 9: FLAIR · axial · 5.0mm · 0.45mm/px · z∈[-42,+101]mm · 2 of 24 slices shown]
[im 1/24]
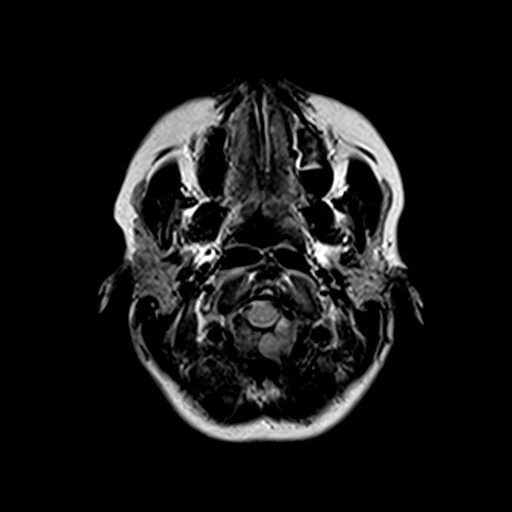
[im 24/24]
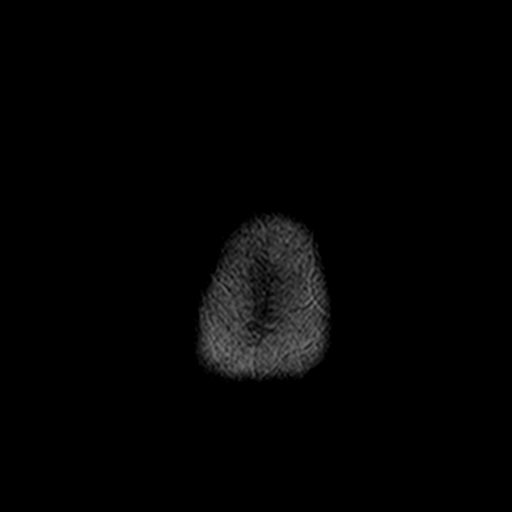

[Series 10: t2_tse_tra_512 · axial · 5.0mm · 0.60mm/px · z∈[-47,+103]mm · 2 of 25 slices shown]
[im 1/25]
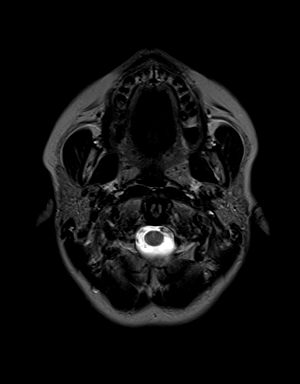
[im 25/25]
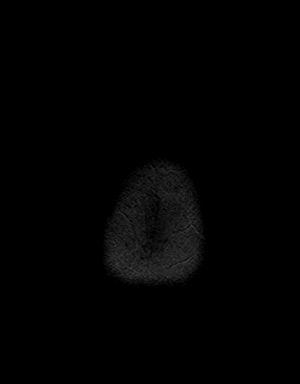

[Series 11: t1_mpr_tra · axial · 2.0mm · 0.45mm/px · z∈[-48,+85]mm · 7 of 80 slices shown]
[im 1/80]
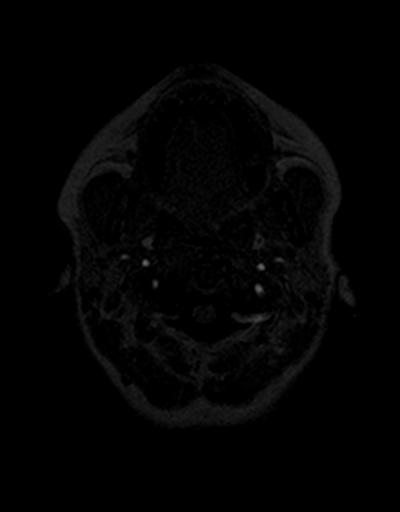
[im 12/80]
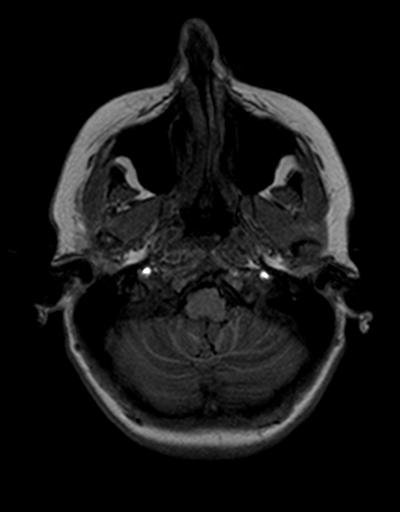
[im 23/80]
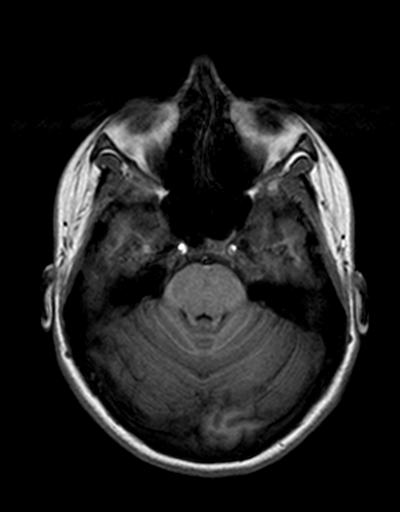
[im 34/80]
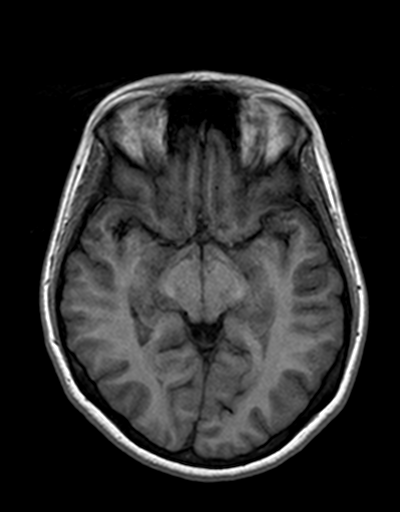
[im 46/80]
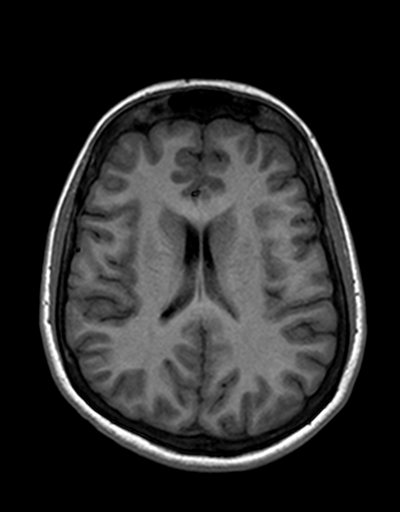
[im 57/80]
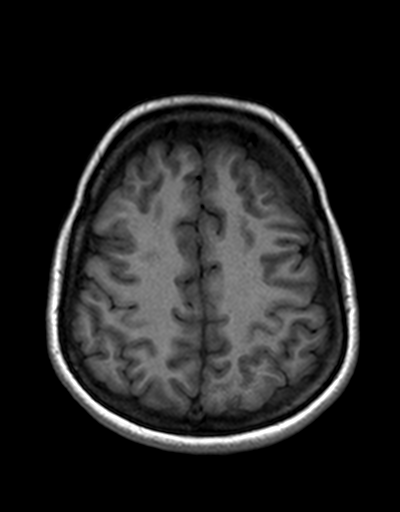
[im 68/80]
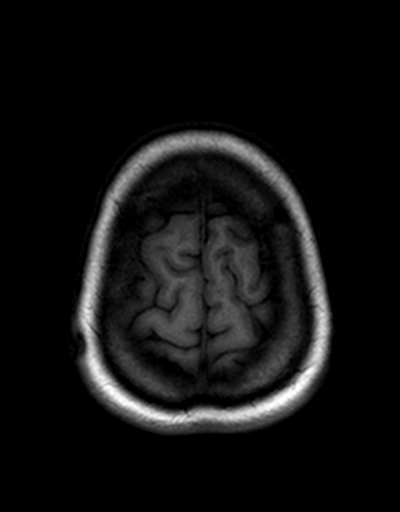

[Series 12: T2 · coronal · 5.0mm · 0.45mm/px · 2 of 26 slices shown]
[im 1/26]
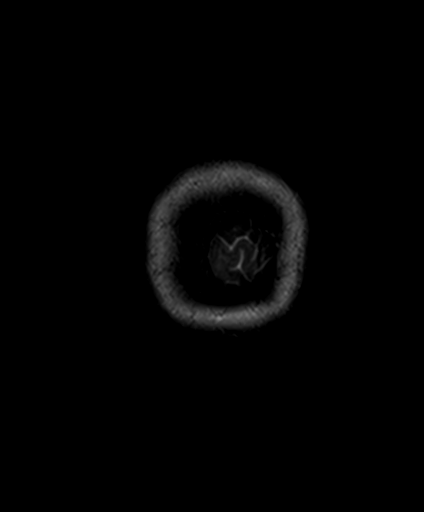
[im 26/26]
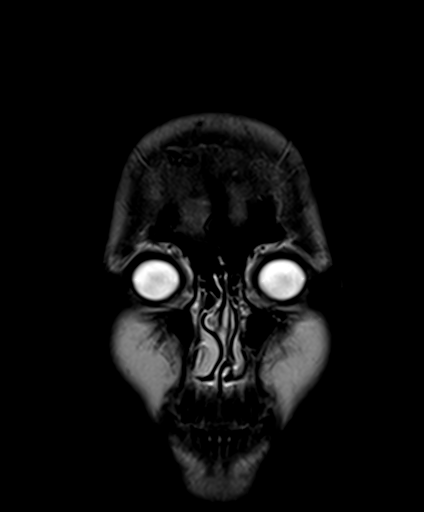

[47 of 48 positions shown; findings below may reference images not displayed]

FINDINGS: Calvarium and upper cervical spine: No marrow signal abnormality.

Orbits: No significant findings.

Sinuses: Clear. Mastoid and middle ears are clear.

Brain: Cerebellar tonsillar ectopia as noted previously, with up to
7 mm of descent below the foramen magnum. There is no pointing or
high-grade foramen magnum stenosis typical of Chiari 1 malformation.
No visible syrinx.

No acute infarct, hemorrhage, hydrocephalus, or mass lesion. No
evidence of large vessel occlusion. No white matter disease.
IMPRESSION: 1. Cerebellar tonsillar ectopia without foramen magnum crowding. Are
the patient's headaches occipital?
2. Otherwise, negative brain MRI.

## 2016-02-23 ENCOUNTER — Ambulatory Visit: Payer: BC Managed Care – PPO | Admitting: Family

## 2016-09-05 ENCOUNTER — Telehealth: Payer: Self-pay | Admitting: Family

## 2016-09-05 MED ORDER — OSELTAMIVIR PHOSPHATE 75 MG PO CAPS: 75.0000 mg | ORAL_CAPSULE | Freq: Every day | ORAL | 0 refills | Status: DC

## 2016-09-05 NOTE — Telephone Encounter (Signed)
Please advise 

## 2016-09-05 NOTE — Telephone Encounter (Signed)
Pt aware.

## 2016-09-05 NOTE — Telephone Encounter (Signed)
Medication sent to pharmacy  

## 2016-09-05 NOTE — Telephone Encounter (Signed)
Both pt 41 yr old and 7 year tested positive for the flu.  She said she is starting to have some symptom  and would like to know if greg can call in tamaflu   Pharmacy [facility]

## 2016-09-19 ENCOUNTER — Encounter: Payer: Self-pay | Admitting: Internal Medicine

## 2016-09-19 ENCOUNTER — Ambulatory Visit (INDEPENDENT_AMBULATORY_CARE_PROVIDER_SITE_OTHER): Payer: BC Managed Care – PPO | Admitting: Internal Medicine

## 2016-09-19 DIAGNOSIS — R69 Illness, unspecified: Secondary | ICD-10-CM

## 2016-09-19 DIAGNOSIS — J111 Influenza due to unidentified influenza virus with other respiratory manifestations: Secondary | ICD-10-CM | POA: Insufficient documentation

## 2016-09-19 MED ORDER — CETIRIZINE HCL 10 MG PO TABS: 10.0000 mg | ORAL_TABLET | Freq: Every day | ORAL | 11 refills | Status: DC

## 2016-09-19 MED ORDER — HYDROCODONE-HOMATROPINE 5-1.5 MG/5ML PO SYRP: 5.0000 mL | ORAL_SOLUTION | Freq: Four times a day (QID) | ORAL | 0 refills | Status: DC | PRN

## 2016-09-19 NOTE — Progress Notes (Signed)
   Subjective:    Patient ID: Kimberly Fernandez, female    Hessie KnowsDOB: March 28, 1976, 41 y.o.   MRN: [id number]  HPI The patient is a 41 YO female coming in for cough and congestion. She started having fevers and headaches last week. Has been around sick contacts. She is taking ibuprofen at home which is helping some with the headaches. She is overall stable since onset. Some congestion and cough which is limiting her sleep. She denies sore throat or problems swallowing. Flu at her household in the last 2 weeks.   Review of Systems  Constitutional: Positive for activity change, appetite change, chills, fatigue and fever. Negative for unexpected weight change.  HENT: Positive for congestion, postnasal drip, rhinorrhea and sinus pressure. Negative for ear discharge, ear pain, sinus pain, sore throat and trouble swallowing.   Eyes: Negative.   Respiratory: Positive for cough. Negative for chest tightness, shortness of breath and wheezing.   Cardiovascular: Negative.   Gastrointestinal: Negative.   Musculoskeletal: Positive for myalgias.  Neurological: Positive for headaches.      Objective:   Physical Exam  Constitutional: She is oriented to person, place, and time. She appears well-developed and well-nourished.  HENT:  Head: Normocephalic and atraumatic.  Right Ear: External ear normal.  Left Ear: External ear normal.  Oropharynx with redness and clear drainage.   Eyes: EOM are normal.  Neck: Normal range of motion.  Cardiovascular: Normal rate and regular rhythm.   Pulmonary/Chest: Effort normal and breath sounds normal. No respiratory distress. She has no wheezes. She has no rales.  Abdominal: Soft.  Lymphadenopathy:    She has no cervical adenopathy.  Neurological: She is alert and oriented to person, place, and time.  Skin: Skin is warm and dry.   Vitals:   09/19/16 1050  BP: 122/80  Pulse: 80  Temp: 98.2 F (36.8 C)  TempSrc: Oral  SpO2: 100%  Weight: 183 lb (83 kg)  Height: 5\' 4"   (1.626 m)      Assessment & Plan:

## 2016-09-19 NOTE — Patient Instructions (Signed)
We have sent in the zyrtec to the pharmacy that you take daily for the congestion.   We have also given you the cough syrup today.    Upper Respiratory Infection, Adult Most upper respiratory infections (URIs) are a viral infection of the air passages leading to the lungs. A URI affects the nose, throat, and upper air passages. The most common type of URI is nasopharyngitis and is typically referred to as "the common cold." URIs run their course and usually go away on their own. Most of the time, a URI does not require medical attention, but sometimes a bacterial infection in the upper airways can follow a viral infection. This is called a secondary infection. Sinus and middle ear infections are common types of secondary upper respiratory infections. Bacterial pneumonia can also complicate a URI. A URI can worsen asthma and chronic obstructive pulmonary disease (COPD). Sometimes, these complications can require emergency medical care and may be life threatening. What are the causes? Almost all URIs are caused by viruses. A virus is a type of germ and can spread from one person to another. What increases the risk? You may be at risk for a URI if:  You smoke.  You have chronic heart or lung disease.  You have a weakened defense (immune) system.  You are very young or very old.  You have nasal allergies or asthma.  You work in crowded or poorly ventilated areas.  You work in health care facilities or schools. What are the signs or symptoms? Symptoms typically develop 2-3 days after you come in contact with a cold virus. Most viral URIs last 7-10 days. However, viral URIs from the influenza virus (flu virus) can last 14-18 days and are typically more severe. Symptoms may include:  Runny or stuffy (congested) nose.  Sneezing.  Cough.  Sore throat.  Headache.  Fatigue.  Fever.  Loss of appetite.  Pain in your forehead, behind your eyes, and over your cheekbones (sinus  pain).  Muscle aches. How is this diagnosed? Your health care provider may diagnose a URI by:  Physical exam.  Tests to check that your symptoms are not due to another condition such as:  Strep throat.  Sinusitis.  Pneumonia.  Asthma. How is this treated? A URI goes away on its own with time. It cannot be cured with medicines, but medicines may be prescribed or recommended to relieve symptoms. Medicines may help:  Reduce your fever.  Reduce your cough.  Relieve nasal congestion. Follow these instructions at home:  Take medicines only as directed by your health care provider.  Gargle warm saltwater or take cough drops to comfort your throat as directed by your health care provider.  Use a warm mist humidifier or inhale steam from a shower to increase air moisture. This may make it easier to breathe.  Drink enough fluid to keep your urine clear or pale yellow.  Eat soups and other clear broths and maintain good nutrition.  Rest as needed.  Return to work when your temperature has returned to normal or as your health care provider advises. You may need to stay home longer to avoid infecting others. You can also use a face mask and careful hand washing to prevent spread of the virus.  Increase the usage of your inhaler if you have asthma.  Do not use any tobacco products, including cigarettes, chewing tobacco, or electronic cigarettes. If you need help quitting, ask your health care provider. How is this prevented? The best way  to protect yourself from getting a cold is to practice good hygiene.  Avoid oral or hand contact with people with cold symptoms.  Wash your hands often if contact occurs. There is no clear evidence that vitamin C, vitamin E, echinacea, or exercise reduces the chance of developing a cold. However, it is always recommended to get plenty of rest, exercise, and practice good nutrition. Contact a health care provider if:  You are getting worse  rather than better.  Your symptoms are not controlled by medicine.  You have chills.  You have worsening shortness of breath.  You have brown or red mucus.  You have yellow or brown nasal discharge.  You have pain in your face, especially when you bend forward.  You have a fever.  You have swollen neck glands.  You have pain while swallowing.  You have white areas in the back of your throat. Get help right away if:  You have severe or persistent:  Headache.  Ear pain.  Sinus pain.  Chest pain.  You have chronic lung disease and any of the following:  Wheezing.  Prolonged cough.  Coughing up blood.  A change in your usual mucus.  You have a stiff neck.  You have changes in your:  Vision.  Hearing.  Thinking.  Mood. This information is not intended to replace advice given to you by your health care provider. Make sure you discuss any questions you have with your health care provider. Document Released: 01/25/2001 Document Revised: 04/03/2016 Document Reviewed: 11/06/2013 Elsevier Interactive Patient Education  2017 [employer]

## 2016-09-19 NOTE — Progress Notes (Signed)
Pre visit review using our clinic review tool, if applicable. No additional management support is needed unless otherwise documented below in the visit note. 

## 2016-09-19 NOTE — Assessment & Plan Note (Signed)
Outside range for tamiflu. Continue to use ibuprofen otc and sent in zyrtec for congestion. Rx for hycodan for the cough. Advised of typical course and reassurance given that no steroids or antibiotics are indicated.

## 2017-03-14 ENCOUNTER — Ambulatory Visit (INDEPENDENT_AMBULATORY_CARE_PROVIDER_SITE_OTHER): Payer: BC Managed Care – PPO | Admitting: Family

## 2017-03-14 ENCOUNTER — Encounter: Payer: Self-pay | Admitting: Family

## 2017-03-14 VITALS — BP 124/77 | HR 55 | Temp 98.1°F | Resp 16 | Ht 64.0 in | Wt 173.4 lb

## 2017-03-14 DIAGNOSIS — N926 Irregular menstruation, unspecified: Secondary | ICD-10-CM | POA: Diagnosis not present

## 2017-03-14 LAB — POCT URINE PREGNANCY: PREG TEST UR: NEGATIVE

## 2017-03-14 NOTE — Assessment & Plan Note (Signed)
New onset missed menstrual cycle in the setting of irregular menstrual cycles appears to be activity and weight change related. In office pregnancy test was negative. Patient reassured. Recommend continued follow up with gynecology as scheduled.

## 2017-03-14 NOTE — Progress Notes (Signed)
Subjective:    Patient ID: Kimberly Fernandez, female    DOB: 12-22-1975, 41 y.o.   MRN: [id number]  Chief Complaint  Patient presents with  . Amenorrhea    has not had period since 12/27/16, took a pregnancy test the end of june which was negative     HPI:  Kimberly Fernandez is a 41 y.o. female who  has a past medical history of Albinism (HCC); Anxiety; Asthma; Depression; GERD (gastroesophageal reflux disease); Migraines; Nystagmus; and Reflux. and presents today for an office visit.   This is a new problem. Associated symptoms of missed menstrual cycle has been going on for about 2 months with the last cycle on on 12/27/2016. Cycles previously were noted to be irregular mostly around 28 days with some fluctuations up to about 2 weeks. Has noted new onset hot flashes around the time that she was supposed to start her next menstrual cycle. Has increased her activity level working to lose weight and has lost about 10 pounds. Last pap smear and gynecology exam was last year and is scheduled for gynecology.   Wt Readings from Last 3 Encounters:  03/14/17 173 lb 6.4 oz (78.7 kg)  09/19/16 183 lb (83 kg)  01/07/16 179 lb 8 oz (81.4 kg)      Allergies  Allergen Reactions  . Doxycycline Rash      Outpatient Medications Prior to Visit  Medication Sig Dispense Refill  . ALPRAZolam (XANAX) 0.25 MG tablet Take 1 tablet (0.25 mg total) by mouth 2 (two) times daily as needed for anxiety. 20 tablet 0  . cetirizine (ZYRTEC) 10 MG tablet Take 1 tablet (10 mg total) by mouth daily. 30 tablet 11  . HYDROcodone-homatropine (HYCODAN) 5-1.5 MG/5ML syrup Take 5 mLs by mouth every 6 (six) hours as needed for cough. 120 mL 0  . Ibuprofen (ADVIL MIGRAINE) 200 MG CAPS Take 200 mg by mouth every 6 (six) hours as needed (migraines).     . sertraline (ZOLOFT) 50 MG tablet Take 1 tablet (50 mg total) by mouth daily. 90 tablet 3   No facility-administered medications prior to visit.      Past Medical History:    Diagnosis Date  . Albinism (HCC)   . Anxiety   . Asthma   . Depression   . GERD (gastroesophageal reflux disease)   . Migraines   . Nystagmus   . Reflux       Review of Systems  Constitutional: Negative for chills and fever.  Respiratory: Negative for chest tightness and shortness of breath.   Cardiovascular: Negative for chest pain, palpitations and leg swelling.  Genitourinary: Positive for menstrual problem. Negative for decreased urine volume, difficulty urinating, dyspareunia, dysuria, enuresis, flank pain, frequency, hematuria, pelvic pain, urgency, vaginal bleeding, vaginal discharge and vaginal pain.      Objective:    BP 124/77 (BP Location: Left Arm, Patient Position: Sitting, Cuff Size: Large)   Pulse (!) 55   Temp 98.1 F (36.7 C) (Oral)   Resp 16   Ht 5\' 4"  (1.626 m)   Wt 173 lb 6.4 oz (78.7 kg)   SpO2 98%   BMI 29.76 kg/m  Nursing note and vital signs reviewed.  Physical Exam  Constitutional: She is oriented to person, place, and time. She appears well-developed and well-nourished. No distress.  Cardiovascular: Normal rate, regular rhythm, normal heart sounds and intact distal pulses.   Pulmonary/Chest: Effort normal and breath sounds normal.  Abdominal: Soft. Bowel sounds are normal. She exhibits no  distension and no mass. There is no tenderness. There is no rebound and no guarding.  Genitourinary:  Genitourinary Comments: Pelvic exam deferred to GYN  Neurological: She is alert and oriented to person, place, and time.  Skin: Skin is warm and dry.  Psychiatric: She has a normal mood and affect. Her behavior is normal. Judgment and thought content normal.       Assessment & Plan:   Problem List Items Addressed This Visit      Other   Irregular menstruation - Primary    New onset missed menstrual cycle in the setting of irregular menstrual cycles appears to be activity and weight change related. In office pregnancy test was negative. Patient  reassured. Recommend continued follow up with gynecology as scheduled.       Relevant Orders   POCT urine pregnancy (Completed)       I have discontinued Ms. Brownley's Ibuprofen, ALPRAZolam, sertraline, HYDROcodone-homatropine, and cetirizine.   Follow-up: Return if symptoms worsen or fail to improve.  Jeanine Luzalone, Gregory, FNP

## 2017-03-14 NOTE — Patient Instructions (Signed)
Thank you for choosing [facility].  SUMMARY AND INSTRUCTIONS:  Negative pregnancy test today.  Menstrual changes likely related to exercise.  Keep with the great work!  Follow up with gynecology as planned.   Follow up:  If your symptoms worsen or fail to improve, please contact our office for further instruction, or in case of emergency go directly to the emergency room at the closest medical facility.   Secondary Amenorrhea Secondary amenorrhea is the stopping of menstrual flow for 3-6 months in a female who has previously had periods. There are many possible causes. Most of these causes are not serious. Usually, treating the underlying problem causing the loss of menses will return your periods to normal. What are the causes? Some common and uncommon causes of not menstruating include:  Malnutrition.  Low blood sugar (hypoglycemia).  Polycystic ovary disease.  Stress or fear.  Breastfeeding.  Hormone imbalance.  Ovarian failure.  Medicines.  Extreme obesity.  Cystic fibrosis.  Low body weight or drastic weight reduction from any cause.  Early menopause.  Removal of ovaries or uterus.  Contraceptives.  Illness.  Long-term (chronic) illnesses.  Cushing syndrome.  Thyroid problems.  Birth control pills, patches, or vaginal rings for birth control.  What increases the risk? You may be at greater risk of secondary amenorrhea if:  You have a family history of this condition.  You have an eating disorder.  You do athletic training.  How is this diagnosed? A diagnosis is made by your health care provider taking a medical history and doing a physical exam. This will include a pelvic exam to check for problems with your reproductive organs. Pregnancy must be ruled out. Often, numerous blood tests are done to measure different hormones in the body. Urine testing may be done. Specialized exams (ultrasound, CT scan, MRI, or hysteroscopy) may have to  be done as well as measuring the body mass index (BMI). How is this treated? Treatment depends on the cause of the amenorrhea. If an eating disorder is present, this can be treated with an adequate diet and therapy. Chronic illnesses may improve with treatment of the illness. Amenorrhea may be corrected with medicines, lifestyle changes, or surgery. If the amenorrhea cannot be corrected, it is sometimes possible to create a false menstruation with medicines. Follow these instructions at home:  Maintain a healthy diet.  Manage weight problems.  Exercise regularly but not excessively.  Get adequate sleep.  Manage stress.  Be aware of changes in your menstrual cycle. Keep a record of when your periods occur. Note the date your period starts, how long it lasts, and any problems. Contact a health care provider if: Your symptoms do not get better with treatment. This information is not intended to replace advice given to you by your health care provider. Make sure you discuss any questions you have with your health care provider. Document Released: 09/12/2006 Document Revised: 01/07/2016 Document Reviewed: 01/17/2013 Elsevier Interactive Patient Education  [employer]2018 Elsevier Inc.

## 2017-08-02 ENCOUNTER — Encounter: Payer: Self-pay | Admitting: Family

## 2017-08-02 ENCOUNTER — Ambulatory Visit: Payer: BC Managed Care – PPO | Admitting: Family

## 2017-08-02 VITALS — BP 118/70 | HR 52 | Temp 97.6°F | Ht 64.0 in | Wt 157.0 lb

## 2017-08-02 DIAGNOSIS — J019 Acute sinusitis, unspecified: Secondary | ICD-10-CM | POA: Diagnosis not present

## 2017-08-02 MED ORDER — CEFDINIR 300 MG PO CAPS: 300.0000 mg | ORAL_CAPSULE | Freq: Two times a day (BID) | ORAL | 0 refills | Status: DC

## 2017-08-02 MED ORDER — FLUTICASONE PROPIONATE 50 MCG/ACT NA SUSP: 2.0000 | Freq: Every day | NASAL | 1 refills | Status: DC

## 2017-08-02 NOTE — Progress Notes (Signed)
Kimberly Fernandez is a 41 y.o. female with the following history as recorded in [employer]:  Patient Active Problem List   Diagnosis Date Noted  . Irregular menstruation 03/14/2017  . Influenza-like illness 09/19/2016  . Acute pharyngitis 01/05/2016  . Encounter for general adult medical examination with abnormal findings 10/08/2015  . Anxiety state 02/24/2015  . Migraine 02/24/2015  . Medial tibial stress syndrome 12/01/2014  . Pancreatic lesion 11/04/2014  . Hiatal hernia 11/04/2014  . Shigella infection 11/04/2014  . 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 PIH (pregnancy induced hypertension) 10/23/2012  . NSVD (normal spontaneous vaginal delivery) 10/23/2012  . Normal vaginal delivery 02/26/2011    Current Outpatient Medications  Medication Sig Dispense Refill  . cefdinir (OMNICEF) 300 MG capsule Take 1 capsule (300 mg total) by mouth 2 (two) times daily. 20 capsule 0  . fluticasone (FLONASE) 50 MCG/ACT nasal spray Place 2 sprays into both nostrils daily. 16 g 1   No current facility-administered medications for this visit.     Allergies: Doxycycline  Past Medical History:  Diagnosis Date  . Albinism (HCC)   . Anxiety   . Asthma   . Depression   . GERD (gastroesophageal reflux disease)   . Migraines   . Nystagmus   . Reflux     Past Surgical History:  Procedure Laterality Date  . EYE SURGERY    . TOOTH EXTRACTION    . UPPER GASTROINTESTINAL ENDOSCOPY      Family History  Problem Relation Age of Onset  . Hypertension Father   . Cancer Father        lung  . Heart disease Maternal Grandmother   . Cancer Maternal Grandfather        lung  . Migraines Mother   . Migraines Son   . Diabetes Maternal Aunt   . Diabetes Maternal Uncle     Social History   Tobacco Use  . Smoking status: Never Smoker  . Smokeless tobacco: Never Used  Substance Use Topics  . Alcohol use: Yes    Alcohol/week: 0.0 oz    Comment: occasional    Subjective:  Patient presents with one week history  of sinus pain/ pressure; has tried using OTC Benadryl, Mucinex, Tylenol with little relief; does get sinus infections periodically; denies any fever or chest pain, shortness of breath; + headache;    Objective:  Vitals:   08/02/17 1355  BP: 118/70  Pulse: (!) 52  Temp: 97.6 F (36.4 C)  TempSrc: Oral  SpO2: 100%  Weight: 157 lb 0.6 oz (71.2 kg)  Height: 5\' 4"  (1.626 m)    General: Well developed, well nourished, in no acute distress  Skin : Warm and dry.  Head: Normocephalic and atraumatic  Eyes: Sclera and conjunctiva clear; pupils round and reactive to light; extraocular movements intact  Ears: External normal; canals clear; tympanic membranes congested bilaterally Oropharynx: Pink, supple. No suspicious lesions  Neck: Supple without thyromegaly, adenopathy  Lungs: Respirations unlabored; clear to auscultation bilaterally without wheeze, rales, rhonchi  CVS exam: normal rate and regular rhythm.  Neurologic: Alert and oriented; speech intact; face symmetrical; moves all extremities well; CNII-XII intact without focal deficit   Assessment:  1. Acute sinusitis, recurrence not specified, unspecified location     Plan:  Rx for Omnicef 300 mg bid x 10 days; Rx for Flonase; increase fluids, rest;  Keep planned follow-up with GYN; encouraged to schedule CPE with PCP here, Asheligh Shambleigh.  Return if symptoms worsen or fail to improve.  No  orders of the defined types were placed in this encounter.   Requested Prescriptions   Signed Prescriptions Disp Refills  . fluticasone (FLONASE) 50 MCG/ACT nasal spray 16 g 1    Sig: Place 2 sprays into both nostrils daily.  . cefdinir (OMNICEF) 300 MG capsule 20 capsule 0    Sig: Take 1 capsule (300 mg total) by mouth 2 (two) times daily.

## 2017-08-02 NOTE — Patient Instructions (Signed)
Please schedule follow-up with Kimberly Fernandez- CPE

## 2017-09-21 ENCOUNTER — Ambulatory Visit: Payer: BC Managed Care – PPO | Admitting: Urgent Care

## 2017-09-22 ENCOUNTER — Ambulatory Visit: Payer: BC Managed Care – PPO | Admitting: Family

## 2017-09-22 ENCOUNTER — Ambulatory Visit: Payer: BC Managed Care – PPO | Admitting: Family Medicine

## 2017-10-17 ENCOUNTER — Encounter: Payer: Self-pay | Admitting: Nurse Practitioner

## 2017-10-17 ENCOUNTER — Ambulatory Visit: Payer: BC Managed Care – PPO | Admitting: Nurse Practitioner

## 2017-10-17 ENCOUNTER — Other Ambulatory Visit (INDEPENDENT_AMBULATORY_CARE_PROVIDER_SITE_OTHER): Payer: BC Managed Care – PPO

## 2017-10-17 VITALS — BP 140/88 | HR 53 | Temp 97.9°F | Resp 16 | Ht 64.0 in | Wt 150.0 lb

## 2017-10-17 DIAGNOSIS — Z0001 Encounter for general adult medical examination with abnormal findings: Secondary | ICD-10-CM

## 2017-10-17 DIAGNOSIS — E7030 Albinism, unspecified: Secondary | ICD-10-CM | POA: Diagnosis not present

## 2017-10-17 DIAGNOSIS — L989 Disorder of the skin and subcutaneous tissue, unspecified: Secondary | ICD-10-CM

## 2017-10-17 DIAGNOSIS — Z13220 Encounter for screening for lipoid disorders: Secondary | ICD-10-CM

## 2017-10-17 DIAGNOSIS — R009 Unspecified abnormalities of heart beat: Secondary | ICD-10-CM

## 2017-10-17 DIAGNOSIS — M79673 Pain in unspecified foot: Secondary | ICD-10-CM | POA: Diagnosis not present

## 2017-10-17 DIAGNOSIS — R030 Elevated blood-pressure reading, without diagnosis of hypertension: Secondary | ICD-10-CM | POA: Diagnosis not present

## 2017-10-17 DIAGNOSIS — R739 Hyperglycemia, unspecified: Secondary | ICD-10-CM | POA: Diagnosis not present

## 2017-10-17 LAB — LIPID PANEL
Cholesterol: 143 mg/dL (ref 0–200)
HDL: 55.6 mg/dL (ref >39.00)
LDL CALC: 72 mg/dL (ref 0–99)
NonHDL: 87.25
TRIGLYCERIDES: 77 mg/dL (ref 0.0–149.0)
Total CHOL/HDL Ratio: 3
VLDL: 15.4 mg/dL (ref 0.0–40.0)

## 2017-10-17 LAB — COMPREHENSIVE METABOLIC PANEL
ALBUMIN: 4.4 g/dL (ref 3.5–5.2)
ALT: 10 U/L (ref 0–35)
AST: 15 U/L (ref 0–37)
Alkaline Phosphatase: 54 U/L (ref 39–117)
BUN: 15 mg/dL (ref 6–23)
CHLORIDE: 106 meq/L (ref 96–112)
CO2: 26 mEq/L (ref 19–32)
Calcium: 9.8 mg/dL (ref 8.4–10.5)
Creatinine, Ser: 0.78 mg/dL (ref 0.40–1.20)
GFR: 87.07 mL/min (ref >60.00)
Glucose, Bld: 87 mg/dL (ref 70–99)
Potassium: 3.7 mEq/L (ref 3.5–5.1)
Sodium: 139 mEq/L (ref 135–145)
Total Bilirubin: 0.5 mg/dL (ref 0.2–1.2)
Total Protein: 7.3 g/dL (ref 6.0–8.3)

## 2017-10-17 LAB — HEMOGLOBIN A1C: Hgb A1c MFr Bld: 4.7 % (ref 4.6–6.5)

## 2017-10-17 NOTE — Progress Notes (Signed)
Name: Kimberly Fernandez   MRN: [id number]    DOB: 06/02/1976   Date:10/17/2017       Progress Note  Subjective  Chief Complaint  Chief Complaint  Patient presents with  . CPE    not fasting, foot problem    HPI  Patient presents for annual CPE.  Diet: Breakfast- toast, jelly and eggs; Lunch- salad and fruit; Dinner-protein-meat with fruits and vegetables; Snack- fruit bar; Drink- water, coffee, occasionally a soda Exercise: zumba, swimming, resistance training, running about 7 days a week Lost 35 lbs this year through active diet and exercise  USPSTF grade A and B recommendations  Depression: No concerns for anxiety or depression today- she does have stressful home environment due to special needs child Depression screen [facility] 2/9 01/20/2015 11/27/2014  Decreased Interest 0 0  Down, Depressed, Hopeless 0 0  PHQ - 2 Score 0 0   Hypertension: currently having foot pain which she feels may be contributing to her higher BP reading BP Readings from Last 3 Encounters:  10/17/17 140/88  08/02/17 118/70  03/14/17 124/77   Obesity: Wt Readings from Last 3 Encounters:  10/17/17 150 lb (68 kg)  08/02/17 157 lb 0.6 oz (71.2 kg)  03/14/17 173 lb 6.4 oz (78.7 kg)   BMI Readings from Last 3 Encounters:  10/17/17 25.75 kg/m  08/02/17 26.96 kg/m  03/14/17 29.76 kg/m    Alcohol: No- rare social drinker Tobacco use: No, never HIV: declines screening STD testing and prevention (chl/gon/syphilis): no concerns Intimate partner violence: denies- feels safe at home  Vaccinations: up to date  Advanced Care Planning: A voluntary discussion about advance care planning including the explanation and discussion of advance directives.  Discussed health care proxy and Living will, and the patient DOES NOT have a living will at present time. If patient does have living will, I have requested they bring this to the clinic to be scanned in to their chart.  Breast cancer: follows with GYN provider for  womens health Cervical cancer screening: follows with GYN provider for womens health  Lipids:  Lab Results  Component Value Date   CHOL 155 10/13/2015   Lab Results  Component Value Date   HDL 45.70 10/13/2015   Lab Results  Component Value Date   LDLCALC 98 10/13/2015   Lab Results  Component Value Date   TRIG 55.0 10/13/2015   Lab Results  Component Value Date   CHOLHDL 3 10/13/2015   No results found for: LDLDIRECT  Glucose:  Glucose, Bld  Date Value Ref Range Status  10/13/2015 91 70 - 99 mg/dL Final  40/98/1191 478 (H) 70 - 99 mg/dL Final  29/56/2130 865 (H) 70 - 99 mg/dL Final    Skin cancer: No concerning lesions or moles today Aspirin: not indicated ECG: today   Patient Active Problem List   Diagnosis Date Noted  . Irregular menstruation 03/14/2017  . Influenza-like illness 09/19/2016  . Acute pharyngitis 01/05/2016  . Encounter for general adult medical examination with abnormal findings 10/08/2015  . Anxiety state 02/24/2015  . Migraine 02/24/2015  . Medial tibial stress syndrome 12/01/2014  . Pancreatic lesion 11/04/2014  . Hiatal hernia 11/04/2014  . Shigella infection 11/04/2014  . 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 PIH (pregnancy induced hypertension) 10/23/2012  . NSVD (normal spontaneous vaginal delivery) 10/23/2012  . Normal vaginal delivery 02/26/2011    Past Surgical History:  Procedure Laterality Date  . EYE SURGERY    . TOOTH EXTRACTION    . UPPER GASTROINTESTINAL  ENDOSCOPY      Family History  Problem Relation Age of Onset  . Hypertension Father   . Cancer Father        lung  . Heart disease Maternal Grandmother   . Cancer Maternal Grandfather        lung  . Migraines Mother   . Migraines Son   . Diabetes Maternal Aunt   . Diabetes Maternal Uncle     Social History   Socioeconomic History  . Marital status: Married    Spouse name: Not on file  . Number of children: 3  . Years of education: 4418  . Highest education  level: Not on file  Social Needs  . Financial resource strain: Not on file  . Food insecurity - worry: Not on file  . Food insecurity - inability: Not on file  . Transportation needs - medical: Not on file  . Transportation needs - non-medical: Not on file  Occupational History  . Occupation: Personal assistantLab Manager  Tobacco Use  . Smoking status: Never Smoker  . Smokeless tobacco: Never Used  Substance and Sexual Activity  . Alcohol use: Yes    Alcohol/week: 0.0 oz    Comment: occasional  . Drug use: No  . Sexual activity: Yes    Birth control/protection: None    Comment: NFP  Other Topics Concern  . Not on file  Social History Narrative   [place] Fun: run, crochet   Denies religious beliefs that would effect health care.     No current outpatient medications on file.  Allergies  Allergen Reactions  . Doxycycline Rash     ROS  Constitutional: Negative for fever or weight change.  Respiratory: Negative for cough and shortness of breath.   Cardiovascular: Negative for chest pain or palpitations.  Gastrointestinal: Negative for abdominal pain, no bowel changes.  Musculoskeletal: Negative for gait problem or joint swelling.  Skin: Negative for rash.  Neurological: Negative for syncope, weakness, dizziness or headache.  No other specific complaints in a complete review of systems (except as listed in HPI above).  Abnormal heart rate- her gynecologist told her to follow up with PCP for "abnormal heart beat" She denies any cardiac complaints She was told she had arrhthymia in ER about 10 years ago and had a normal work up then- was told no need for follow up Reports normal lower resting heart rate  Foot pain-left This began about 1 month ago Describes as stiff pain in the arch of her left foot She denies any known injuries but is physically active at gym daily She tried inserts and new shoes with no relief She has had to stop running due to  pain  Skin problem- This began about 3 weeks ago She reports the bump is to her upper anterior thigh-first seemed like a pimple or hair follicle. She was ableto pop and express drainage from the bump when she first noticed it  The bump has remained since and is now painless flat and red  Objective  Vitals:   10/17/17 0944 10/17/17 1055  BP: (!) 144/84 140/88  Pulse: (!) 53   Resp: 16   Temp: 97.9 F (36.6 C)   TempSrc: Oral   SpO2: 99%   Weight: 150 lb (68 kg)   Height: 5\' 4"  (1.626 m)   EKG today, pule rate stable  Body mass index is 25.75 kg/m.  Physical Exam Vital signs reviewed Constitutional: Patient appears well-developed and well-nourished. No  distress.  HENT: Head: Normocephalic and atraumatic. Ears: B TMs ok, no erythema or effusion; Nose: Nose normal. Mouth/Throat: Oropharynx is clear and moist. No oropharyngeal exudate.  Eyes: Conjunctivae are normal. Pupils are equal, round, and reactive to light. No scleral icterus. Nystagmus is noted. Neck: Normal range of motion. Neck supple. No JVD present. No thyromegaly present.  Cardiovascular: Decreased heart rate, regular rhythm and normal heart sounds. No BLE edema. Pulmonary/Chest: Effort normal and breath sounds normal. No respiratory distress. Abdominal: Soft. Bowel sounds are normal, no distension. There is no tenderness. no masses Breast: deferred to GYN FEMALE GENITALIA:  Deferred to GYN Musculoskeletal: Normal range of motion, no joint effusions. No gross deformities Neurological: She is alert and oriented to person, place, and time. No cranial nerve deficit. Coordination, balance, strength, speech and gait are normal.  Skin: Skin is warm and dry. No rash noted. No erythema. Hypopigmented skin and hair. Psychiatric: Patient has a normal mood and affect. behavior is normal. Judgment and thought content normal.    Assessment & Plan RTC in about 2 weeks for blood pressure follow up   Abnormal heart rate She is  asymptomatic. No abnormal rhythm or heart sounds noted on PE - EKG 12-Lead: I personally reviewed the patients EKG today which read sinus bradycardia without acute changes. No ECG on file for comparison We discussed referral to cardiology for further evaluation of abnormal heart rate noted by GYN provider, She declines referral We discussed follow up precautions including chest pain, syncope, shortness of breath, palpitations.  Pain of foot, unspecified laterality Recommend follow up with sports medicine-she was given instructions to schedule appointment at discharge desk today. She declines xray today, would like to follow up with sports medicine first  Skin problem ?scar tissue Does not appear infected She will try scar cream and follow up for sings of infection -See AVS for instruction provided to patient   Elevated blood sugar - Comprehensive metabolic panel; Future - Hemoglobin A1c; Future  Elevated blood pressure reading Noted on 2 readings in the office today She will RTC in about 2 weeks for follow up

## 2017-10-17 NOTE — Patient Instructions (Addendum)
Please head downstairs for lab work.  Please schedule a follow up with our sports medicine providers for your foot pain.  Please let me know if you begin to experience streaking redness, drainage, or pain to the spot on your leg. It looks like scar tissue which can take some time to heal. You could apply over the counter scar cream to the area to see if this helps.  Please return in about 2 weeks so we can follow up on your blood pressure.   Preventive Care 18-39 Years, Female Preventive care refers to lifestyle choices and visits with your health care provider that can promote health and wellness. What does preventive care include?  A yearly physical exam. This is also called an annual well check.  Dental exams once or twice a year.  Routine eye exams. Ask your health care provider how often you should have your eyes checked.  Personal lifestyle choices, including: ? Daily care of your teeth and gums. ? Regular physical activity. ? Eating a healthy diet. ? Avoiding tobacco and drug use. ? Limiting alcohol use. ? Practicing safe sex. ? Taking vitamin and mineral supplements as recommended by your health care provider. What happens during an annual well check? The services and screenings done by your health care provider during your annual well check will depend on your age, overall health, lifestyle risk factors, and family history of disease. Counseling Your health care provider may ask you questions about your:  Alcohol use.  Tobacco use.  Drug use.  Emotional well-being.  Home and relationship well-being.  Sexual activity.  Eating habits.  Work and work Statistician.  Method of birth control.  Menstrual cycle.  Pregnancy history.  Screening You may have the following tests or measurements:  Height, weight, and BMI.  Diabetes screening. This is done by checking your blood sugar (glucose) after you have not eaten for a while (fasting).  Blood  pressure.  Lipid and cholesterol levels. These may be checked every 5 years starting at age 67.  Skin check.  Hepatitis C blood test.  Hepatitis B blood test.  Sexually transmitted disease (STD) testing.  BRCA-related cancer screening. This may be done if you have a family history of breast, ovarian, tubal, or peritoneal cancers.  Pelvic exam and Pap test. This may be done every 3 years starting at age 63. Starting at age 54, this may be done every 5 years if you have a Pap test in combination with an HPV test.  Discuss your test results, treatment options, and if necessary, the need for more tests with your health care provider. Vaccines Your health care provider may recommend certain vaccines, such as:  Influenza vaccine. This is recommended every year.  Tetanus, diphtheria, and acellular pertussis (Tdap, Td) vaccine. You may need a Td booster every 10 years.  Varicella vaccine. You may need this if you have not been vaccinated.  HPV vaccine. If you are 59 or younger, you may need three doses over 6 months.  Measles, mumps, and rubella (MMR) vaccine. You may need at least one dose of MMR. You may also need a second dose.  Pneumococcal 13-valent conjugate (PCV13) vaccine. You may need this if you have certain conditions and were not previously vaccinated.  Pneumococcal polysaccharide (PPSV23) vaccine. You may need one or two doses if you smoke cigarettes or if you have certain conditions.  Meningococcal vaccine. One dose is recommended if you are age 55-21 years and a first-year college student living in a  residence hall, or if you have one of several medical conditions. You may also need additional booster doses.  Hepatitis A vaccine. You may need this if you have certain conditions or if you travel or work in places where you may be exposed to hepatitis A.  Hepatitis B vaccine. You may need this if you have certain conditions or if you travel or work in places where you may  be exposed to hepatitis B.  Haemophilus influenzae type b (Hib) vaccine. You may need this if you have certain risk factors.  Talk to your health care provider about which screenings and vaccines you need and how often you need them. This information is not intended to replace advice given to you by your health care provider. Make sure you discuss any questions you have with your health care provider. Document Released: 09/27/2001 Document Revised: 04/20/2016 Document Reviewed: 06/02/2015 Elsevier Interactive Patient Education  [employer].

## 2017-10-17 NOTE — Assessment & Plan Note (Signed)
-  USPSTF grade A and B recommendations reviewed with patient; age-appropriate recommendations, preventive care, screening tests, etc discussed and encouraged; healthy living encouraged; see AVS for patient education given to patient -Discussed importance of 150 minutes of physical activity weekly, eat 6 servings of fruit/vegetables daily and drink plenty of water and avoid sweet beverages.   -Red flags and when to present for emergency care or RTC including fever >101.87F, chest pain, shortness of breath, new/worsening/un-resolving symptoms, reviewed with patient at time of visit. Follow up and care instructions discussed and provided in AVS.  -Reviewed Health Maintenance: up to date  Screening for cholesterol level- Lipid panel; Future

## 2017-10-19 ENCOUNTER — Encounter: Payer: Self-pay | Admitting: Family Medicine

## 2017-10-19 ENCOUNTER — Ambulatory Visit: Payer: Self-pay

## 2017-10-19 ENCOUNTER — Ambulatory Visit: Payer: BC Managed Care – PPO | Admitting: Family Medicine

## 2017-10-19 VITALS — BP 120/90 | HR 50 | Ht 64.0 in | Wt 151.0 lb

## 2017-10-19 DIAGNOSIS — M79671 Pain in right foot: Secondary | ICD-10-CM

## 2017-10-19 DIAGNOSIS — M79672 Pain in left foot: Principal | ICD-10-CM

## 2017-10-19 DIAGNOSIS — M722 Plantar fascial fibromatosis: Secondary | ICD-10-CM | POA: Diagnosis not present

## 2017-10-19 NOTE — Progress Notes (Signed)
Kimberly Fernandez - 42 y.o. female MRN [id number]  Date of birth: 1975/10/07  SUBJECTIVE:  Including CC & ROS.  Chief Complaint  Patient presents with  . Foot Pain    Kimberly Knowseresa Olivera is a 42 y.o. female that is experiencing left foot pain. Chronic in nature.  Has numbness and tingling in her feet. A lot of burning pain in left foot. Also has a knot on the plantar side of her left foot. "Sharp hard pain". Has noted stiffness in the ankle. Hasn't exercised due to pain. Has used heat, tiger balm, tennis ball rolls, stretches, Ibuprofen. Has purchased insoles. Pain is localized. Denies any injury.     Review of Systems  Constitutional: Negative for fever.  HENT: Negative for congestion.   Respiratory: Negative for cough.   Cardiovascular: Negative for chest pain.  Gastrointestinal: Negative for abdominal pain.  Musculoskeletal: Positive for gait problem.  Skin: Negative for color change.  Allergic/Immunologic: Negative for immunocompromised state.  Neurological: Negative for weakness.  Hematological: Negative for adenopathy.  Psychiatric/Behavioral: Negative for agitation.    HISTORY: Past Medical, Surgical, Social, and Family History Reviewed & Updated per EMR.   Pertinent Historical Findings include:  Past Medical History:  Diagnosis Date  . Albinism (HCC)   . Anxiety   . Asthma   . Depression   . GERD (gastroesophageal reflux disease)   . Migraines   . Nystagmus   . Reflux     Past Surgical History:  Procedure Laterality Date  . EYE SURGERY    . TOOTH EXTRACTION    . UPPER GASTROINTESTINAL ENDOSCOPY      Allergies  Allergen Reactions  . Doxycycline Rash    Family History  Problem Relation Age of Onset  . Hypertension Father   . Cancer Father        lung  . Heart disease Maternal Grandmother   . Cancer Maternal Grandfather        lung  . Migraines Mother   . Migraines Son   . Diabetes Maternal Aunt   . Diabetes Maternal Uncle      Social History    Socioeconomic History  . Marital status: Married    Spouse name: Not on file  . Number of children: 3  . Years of education: 4018  . Highest education level: Not on file  Social Needs  . Financial resource strain: Not on file  . Food insecurity - worry: Not on file  . Food insecurity - inability: Not on file  . Transportation needs - medical: Not on file  . Transportation needs - non-medical: Not on file  Occupational History  . Occupation: Personal assistantLab Manager  Tobacco Use  . Smoking status: Never Smoker  . Smokeless tobacco: Never Used  Substance and Sexual Activity  . Alcohol use: Yes    Alcohol/week: 0.0 oz    Comment: occasional  . Drug use: No  . Sexual activity: Yes    Birth control/protection: None    Comment: NFP  Other Topics Concern  . Not on file  Social History Narrative   [place], [place]. Fun: run, crochet   Denies religious beliefs that would effect health care.      PHYSICAL EXAM:  VS: BP 120/90 (BP Location: Left Arm, Patient Position: Sitting)   Pulse (!) 50   Ht 5\' 4"  (1.626 m)   Wt 151 lb (68.5 kg)   SpO2 99%   BMI 25.92 kg/m  Physical Exam Gen: NAD, alert, cooperative with  exam, well-appearing ENT: normal lips, normal nasal mucosa,  Eye: normal EOM, normal conjunctiva and lids CV:  no edema, +2 pedal pulses   Resp: no accessory muscle use, non-labored,  Skin: no rashes, no areas of induration  Neuro: normal tone, normal sensation to touch Psych:  normal insight, alert and oriented MSK:  Left foot:  No tenderness to palpation of the plantar heel. Normal ankle range of motion. Nodule appreciated at the midpoint of the plantar fascia on the plantar aspect. No abnormal callus or ulcer formation. Neurovascularly intact  Limited ultrasound: Left foot:  Normal-appearing plantar fascia at the calcaneus. No abnormal thickening of the calcaneus of the plantar fascia. 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  - Counseled on obtaining footwear and floating the nodule  - If no improvement could try injection

## 2017-10-19 NOTE — Patient Instructions (Signed)
It looks like you have a plantar fibroma  You could ice and massage the area.  Try cutting a hole in the insole to keep from stepping on the area.  You can also try using a midfoot arch strap to keep compression on the area.

## 2017-10-21 DIAGNOSIS — M722 Plantar fascial fibromatosis: Secondary | ICD-10-CM | POA: Insufficient documentation

## 2017-10-21 NOTE — Assessment & Plan Note (Signed)
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  - Counseled on obtaining footwear and floating the nodule  - If no improvement could try injection

## 2017-10-22 ENCOUNTER — Encounter: Payer: Self-pay | Admitting: Nurse Practitioner

## 2017-10-22 NOTE — Assessment & Plan Note (Signed)
nystagmus of eyes noted on PE- she says this has been present since childhood. Normal finding of albinism Routine eye exams with ophthalmology.

## 2017-11-29 ENCOUNTER — Encounter: Payer: Self-pay | Admitting: Nurse Practitioner

## 2017-11-29 ENCOUNTER — Ambulatory Visit: Payer: BC Managed Care – PPO | Admitting: Nurse Practitioner

## 2017-11-29 VITALS — BP 120/68 | HR 57 | Temp 98.1°F | Resp 16 | Ht 64.0 in | Wt 148.0 lb

## 2017-11-29 DIAGNOSIS — R030 Elevated blood-pressure reading, without diagnosis of hypertension: Secondary | ICD-10-CM

## 2017-11-29 NOTE — Progress Notes (Signed)
Name: Kimberly Fernandez   MRN: [id number]    DOB: 1976-02-14   Date:11/29/2017       Progress Note  Subjective  Chief Complaint  Chief Complaint  Patient presents with  . Follow-up    blood pressure    HPI  Hypertension -Elevated readings on the last 2 visits to clinic with no past history of hypertension. At her last visit, We discussed monitoring BP at home and returning for follow up blood pressure visit.  She has been checking  Her blood pressure routinely at home since our last viist with normal readings- most recent reading 105/68 She tells me today that she realized she had been taking ibuprofen for foot pain for several days before she had the high readings- she has stopped taking the ibuprofen and feels like that might have caused her elevated blood pressure readings. Denies vision changes, chest pain, shortness of breath, edema.  BP Readings from Last 3 Encounters:  11/29/17 120/68  10/19/17 120/90  10/17/17 140/88   Patient Active Problem List   Diagnosis Date Noted  . 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 Irregular menstruation 03/14/2017  . Encounter for general adult medical examination with abnormal findings 10/08/2015  . Anxiety state 02/24/2015  . Migraine 02/24/2015  . Medial tibial stress syndrome 12/01/2014  . Pancreatic lesion 11/04/2014  . Hiatal hernia 11/04/2014  . Shigella infection 11/04/2014  . 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 PIH (pregnancy induced hypertension) 10/23/2012  . NSVD (normal spontaneous vaginal delivery) 10/23/2012  . Normal vaginal delivery 02/26/2011    Past Surgical History:  Procedure Laterality Date  . EYE SURGERY    . TOOTH EXTRACTION    . UPPER GASTROINTESTINAL ENDOSCOPY      Family History  Problem Relation Age of Onset  . Hypertension Father   . Cancer Father        lung  . Heart disease Maternal Grandmother   . Cancer Maternal Grandfather        lung  . Migraines Mother   . Migraines Son   . Diabetes Maternal Aunt   .  Diabetes Maternal Uncle     Social History   Socioeconomic History  . Marital status: Married    Spouse name: Not on file  . Number of children: 3  . Years of education: 19  . Highest education level: Not on file  Occupational History  . Occupation: Personal assistant  Social Needs  . Financial resource strain: Not on file  . Food insecurity:    Worry: Not on file    Inability: Not on file  . Transportation needs:    Medical: Not on file    Non-medical: Not on file  Tobacco Use  . Smoking status: Never Smoker  . Smokeless tobacco: Never Used  Substance and Sexual Activity  . Alcohol use: Yes    Alcohol/week: 0.0 oz    Comment: occasional  . Drug use: No  . Sexual activity: Yes    Birth control/protection: None    Comment: NFP  Lifestyle  . Physical activity:    Days per week: Not on file    Minutes per session: Not on file  . Stress: Not on file  Relationships  . Social connections:    Talks on phone: Not on file    Gets together: Not on file    Attends religious service: Not on file    Active member of club or organization: Not on file    Attends meetings of clubs or organizations: Not  on file    Relationship status: Not on file  . Intimate partner violence:    Fear of current or ex partner: Not on file    Emotionally abused: Not on file    Physically abused: Not on file    Forced sexual activity: Not on file  Other Topics Concern  . Not on file  Social History Narrative   [place], [place]. Fun: run, crochet   Denies religious beliefs that would effect health care.      Current Outpatient Medications:  .  fluticasone (FLONASE) 50 MCG/ACT nasal spray, Place 2 sprays into both nostrils daily., Disp: , Rfl:   Allergies  Allergen Reactions  . Doxycycline Rash     ROS See HPI  Objective  Vitals:   11/29/17 1340  BP: 120/68  Pulse: (!) 57  Resp: 16  Temp: 98.1 F (36.7 C)  TempSrc: Oral  SpO2: 98%  Weight: 148 lb (67.1 kg)   Height: 5\' 4"  (1.626 m)  HR stable  Body mass index is 25.4 kg/m.  Physical Exam Vital signs reviewed Constitutional: Patient appears well-developed and well-nourished. No distress.  HENT: Head: Normocephalic and atraumatic. Nose: Nose normal. Mouth/Throat: Oropharynx is clear and moist.  Eyes: Conjunctivae are normal. Pupils are equal, round, and reactive to light. No scleral icterus. Nystagmus is noted. Neck: Normal range of motion. Neck supple. Cardiovascular: Decreased heart rate, regular rhythm and normal heart sounds. No BLE edema. Pulmonary/Chest: Effort normal and breath sounds normal. No respiratory distress. Neurological: She is alert and oriented to person, place, and time. Coordination, balance, strength, speech and gait are normal.  Skin: Skin is warm and dry. Psychiatric: Patient has a normal mood and affect. behavior is normal. Judgment and thought content normal.  Assessment & Plan RTC in March for CPE Health maintenance is up to date  Elevated blood pressure reading BP has improved at home and on past two clinic readings Will continue to monitor BP readings at home F/U for readings over 140/90

## 2017-11-29 NOTE — Patient Instructions (Addendum)
Your blood pressure has returned to normal.  Please try to check your blood pressure occasionally at home and follow up for readings >140/90.  I will plan to see you back for your annual physical around March 2020- or sooner if you need me.

## 2019-01-28 ENCOUNTER — Telehealth: Payer: Self-pay | Admitting: Internal Medicine

## 2019-01-28 ENCOUNTER — Encounter: Payer: Self-pay | Admitting: Internal Medicine

## 2019-01-28 ENCOUNTER — Ambulatory Visit (INDEPENDENT_AMBULATORY_CARE_PROVIDER_SITE_OTHER): Payer: BC Managed Care – PPO | Admitting: Internal Medicine

## 2019-01-28 DIAGNOSIS — F418 Other specified anxiety disorders: Secondary | ICD-10-CM

## 2019-01-28 DIAGNOSIS — Z0001 Encounter for general adult medical examination with abnormal findings: Secondary | ICD-10-CM | POA: Diagnosis not present

## 2019-01-28 DIAGNOSIS — R739 Hyperglycemia, unspecified: Secondary | ICD-10-CM | POA: Diagnosis not present

## 2019-01-28 MED ORDER — SERTRALINE HCL 100 MG PO TABS: 100.0000 mg | ORAL_TABLET | Freq: Every day | ORAL | 3 refills | Status: DC

## 2019-01-28 NOTE — Telephone Encounter (Signed)
Copied from [facility] [id number]. Topic: Appointment Scheduling - Scheduling Inquiry for Clinic >> Jan 28, 2019  8:02 AM Richardo Priest, NT wrote: Reason for CRM: Patient calling in in need of a TOC appointment and an appointment for depression. Patient would like a virtual visit due to being transportation dependent. Call back is [phone] and patient requested a call back after 10:30.

## 2019-01-28 NOTE — Telephone Encounter (Signed)
Ok with me, thanks.

## 2019-01-28 NOTE — Assessment & Plan Note (Signed)
stable overall by history and exam, recent data reviewed with pt, and pt to continue medical treatment as before,  to f/u any worsening symptoms or concerns, for a1c with lab 

## 2019-01-28 NOTE — Progress Notes (Signed)
Patient ID: Kimberly Fernandez, female   DOB: [birth year], 43 y.o.   MRN: [id number]  Virtual Visit via Video Note  I connected with Kimberly Knowseresa Peel on 01/28/19 at  8:00 PM EDT by a video enabled telemedicine application and verified that I am speaking with the correct person using two identifiers.  Location: Patient: at home Provider: at home   I discussed the limitations of evaluation and management by telemedicine and the availability of in person appointments. The patient expressed understanding and agreed to proceed.  History of Present Illness: Here for wellness and to establish new PCP;  Overall doing ok;  Pt denies Chest pain, worsening SOB, DOE, wheezing, orthopnea, PND, worsening LE edema, palpitations, dizziness or syncope.  Pt denies neurological change such as new headache, facial or extremity weakness.  Pt denies polydipsia, polyuria, or low sugar symptoms. Pt states overall good compliance with treatment and medications, good tolerability, and has been trying to follow appropriate diet.   No fever, night sweats, wt loss, loss of appetite, or other constitutional symptoms.  Pt states good ability with ADL's, has low fall risk, home safety reviewed and adequate, no other significant changes in hearing or vision, and only occasionally active with exercise, as she has been shutin with the children for several month during the pandemic. Also c/o marked increased depressive symptoms as she feels trapped, shutin, in a tunnel with no light at the end, as she is legally blind, has no reliable [employer] bus like transportation and now not even sure if she will have a job in the fall or if and when the 3 kids may go back to school.  No SI or HI but crying much of the time, especailly the last 4 days for some reason.  Husband is supportive but can only do so much and work as well.  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  Does not feel counseling would help.  Has hx of Panic in past, but doe snot feel this currently recently.   Past Medical History:  Diagnosis Date  . Albinism (HCC)   . Anxiety   . Asthma   . Depression   . GERD (gastroesophageal reflux disease)   . Migraines   . Nystagmus   . Reflux    Past Surgical History:  Procedure Laterality Date  . EYE SURGERY    . TOOTH EXTRACTION    . UPPER GASTROINTESTINAL ENDOSCOPY      reports that she has never smoked. She has never used smokeless tobacco. She reports current alcohol use. She reports that she does not use drugs. family history includes Cancer in her father and maternal grandfather; Diabetes in her maternal aunt and maternal uncle; Heart disease in her maternal grandmother; Hypertension in her father; Migraines in her mother and son. Allergies  Allergen Reactions  . Doxycycline Rash   Current Outpatient Medications on File Prior to Visit  Medication Sig Dispense Refill  . [DISCONTINUED] diphenhydramine-acetaminophen (TYLENOL PM) 25-500 MG TABS Take 1 tablet by mouth at bedtime as needed. Patient used medication for sleep.      No current facility-administered medications on file prior to visit.     Observations/Objective: Alert, NAD, depressed nervous mood and  affect, resps normal, cn 2-12 intact, moves all 4s, no visible rash or swelling Lab Results  Component Value Date   WBC 9.4 10/13/2015   HGB 13.7 10/13/2015   HCT 39.8 10/13/2015   PLT 208.0 10/13/2015   GLUCOSE 87 10/17/2017   CHOL 143 10/17/2017   TRIG 77.0 10/17/2017   HDL 55.60 10/17/2017   LDLCALC 72 10/17/2017   ALT 10 10/17/2017   AST 15 10/17/2017   NA 139 10/17/2017   K 3.7 10/17/2017   CL 106 10/17/2017   CREATININE 0.78 10/17/2017   BUN 15 10/17/2017   CO2 26 10/17/2017    TSH 0.73 10/13/2015   HGBA1C 4.7 10/17/2017   Assessment and Plan: See notes  Follow Up Instructions: Seen otes   I discussed the assessment and treatment plan with the patient. The patient was provided an opportunity to ask questions and all were answered. The patient agreed with the plan and demonstrated an understanding of the instructions.   The patient was advised to call back or seek an in-person evaluation if the symptoms worsen or if the condition fails to improve as anticipated   Cathlean Cower, MD

## 2019-01-28 NOTE — Assessment & Plan Note (Addendum)
For tx with zoloft 100 mg (start with half tab x 5 days) , declines counseling, denies SI or HI,  to f/u any worsening symptoms or concerns  In addition to the time spent performing CPE, I spent an additional 15 minutes face to face,in which greater than 50% of this time was spent in counseling and coordination of care for patient's illness as documented, including the differential dx, treatment, further evaluation and other management of depression with anxiety, and hyperglycemia

## 2019-01-28 NOTE — Patient Instructions (Signed)
Please take all new medication as prescribed - the zoloft 100 mg per day (but start with half tab per day for 5 days)  Please call if you would like referral for counseling  Please continue all other medications as before, and refills have been done if requested.  Please have the pharmacy call with any other refills you may need.  Please continue your efforts at being more active, low cholesterol diet, and weight control.  You are otherwise up to date with prevention measures today.  Please keep your appointments with your specialists as you may have planned  Please go to the LAB in the Basement (turn left off the elevator) for the tests to be done at your convenience  You will be contacted by phone if any changes need to be made immediately.  Otherwise, you will receive a letter about your results with an explanation, but please check with MyChart first.  Please remember to sign up for MyChart if you have not done so, as this will be important to you in the future with finding out test results, communicating by private email, and scheduling acute appointments online when needed.  Please return in 6 months, or sooner if needed

## 2019-01-28 NOTE — Assessment & Plan Note (Signed)

## 2019-01-28 NOTE — Telephone Encounter (Signed)
Appointment scheduled.

## 2019-01-28 NOTE — Telephone Encounter (Signed)
Patient is requesting a transfer of care appointment and to discuss depression due to [facility] no longer being here. She is asking that this be done as a virtual visit if possible due to her being transportation dependent. (see message below)  Would you be willing to see this patient to establish care?

## 2019-02-07 ENCOUNTER — Ambulatory Visit: Payer: BC Managed Care – PPO | Admitting: Family Medicine

## 2019-06-16 ENCOUNTER — Encounter: Payer: Self-pay | Admitting: Internal Medicine

## 2019-06-19 ENCOUNTER — Ambulatory Visit (INDEPENDENT_AMBULATORY_CARE_PROVIDER_SITE_OTHER): Payer: BC Managed Care – PPO | Admitting: Internal Medicine

## 2019-06-19 ENCOUNTER — Encounter: Payer: Self-pay | Admitting: Internal Medicine

## 2019-06-19 DIAGNOSIS — R739 Hyperglycemia, unspecified: Secondary | ICD-10-CM | POA: Diagnosis not present

## 2019-06-19 DIAGNOSIS — F418 Other specified anxiety disorders: Secondary | ICD-10-CM | POA: Diagnosis not present

## 2019-06-19 DIAGNOSIS — G2581 Restless legs syndrome: Secondary | ICD-10-CM | POA: Diagnosis not present

## 2019-06-19 MED ORDER — GABAPENTIN 300 MG PO CAPS: 300.0000 mg | ORAL_CAPSULE | Freq: Every day | ORAL | 1 refills | Status: DC

## 2019-06-19 NOTE — Assessment & Plan Note (Signed)
Mild to mod, for gabepentin 300 qhs,  to f/u any worsening symptoms or concerns

## 2019-06-19 NOTE — Progress Notes (Deleted)
   Subjective:    Patient ID: Kimberly Fernandez, female    DOB: 11/20/1975, 43 y.o.   MRN: [id number]  HPI    Review of Systems     Objective:   Physical Exam        Assessment & Plan:

## 2019-06-19 NOTE — Assessment & Plan Note (Signed)
stable overall by history and exam, recent data reviewed with pt, and pt to continue medical treatment as before,  to f/u any worsening symptoms or concerns  

## 2019-06-19 NOTE — Progress Notes (Signed)
Patient ID: Kimberly Fernandez, female   DOB: 11-15-1975, 43 y.o.   MRN: [id number]  Virtual Visit via Video Note  I connected with Kimberly Fernandez on 06/19/19 at  3:00 PM EST by a video enabled telemedicine application and verified that I am speaking with the correct person using two identifiers.  Location: Patient: at home Provider: at office   I discussed the limitations of evaluation and management by telemedicine and the availability of in person appointments. The patient expressed understanding and agreed to proceed.  History of Present Illness: Here to f/u; overall doing ok,  Pt denies chest pain, increasing sob or doe, wheezing, orthopnea, PND, increased LE swelling, palpitations, dizziness or syncope.  Pt denies new neurological symptoms such as new headache, or facial or extremity weakness or numbness.  Pt denies polydipsia, polyuria, but has ongoing 3 mo or more persistent worsening creepy crawly sensation to legs that wont stop moving to try to get to sleep every night.   Pt denies fever, wt loss, night sweats, loss of appetite, or other constitutional symptoms  Denies worsening depressive symptoms, suicidal ideation, or panic; has ongoing anxiety Past Medical History:  Diagnosis Date  . Albinism (HCC)   . Anxiety   . Asthma   . Depression   . GERD (gastroesophageal reflux disease)   . Migraines   . Nystagmus   . Reflux    Past Surgical History:  Procedure Laterality Date  . EYE SURGERY    . TOOTH EXTRACTION    . UPPER GASTROINTESTINAL ENDOSCOPY      reports that she has never smoked. She has never used smokeless tobacco. She reports current alcohol use. She reports that she does not use drugs. family history includes Cancer in her father and maternal grandfather; Diabetes in her maternal aunt and maternal uncle; Heart disease in her maternal grandmother; Hypertension in her father; Migraines in her mother and son. Allergies  Allergen Reactions  . Doxycycline Rash   Current  Outpatient Medications on File Prior to Visit  Medication Sig Dispense Refill  . sertraline (ZOLOFT) 100 MG tablet Take 1 tablet (100 mg total) by mouth daily. 90 tablet 3  . [DISCONTINUED] diphenhydramine-acetaminophen (TYLENOL PM) 25-500 MG TABS Take 1 tablet by mouth at bedtime as needed. Patient used medication for sleep.      No current facility-administered medications on file prior to visit.     Observations/Objective: Alert, NAD, appropriate mood and affect, resps normal, cn 2-12 intact, moves all 4s, no visible rash or swelling Lab Results  Component Value Date   WBC 9.4 10/13/2015   HGB 13.7 10/13/2015   HCT 39.8 10/13/2015   PLT 208.0 10/13/2015   GLUCOSE 87 10/17/2017   CHOL 143 10/17/2017   TRIG 77.0 10/17/2017   HDL 55.60 10/17/2017   LDLCALC 72 10/17/2017   ALT 10 10/17/2017   AST 15 10/17/2017   NA 139 10/17/2017   K 3.7 10/17/2017   CL 106 10/17/2017   CREATININE 0.78 10/17/2017   BUN 15 10/17/2017   CO2 26 10/17/2017   TSH 0.73 10/13/2015   HGBA1C 4.7 10/17/2017   Assessment and Plan: See notes  Follow Up Instructions: See notes   I discussed the assessment and treatment plan with the patient. The patient was provided an opportunity to ask questions and all were answered. The patient agreed with the plan and demonstrated an understanding of the instructions.   The patient was advised to call back or seek an in-person evaluation if the symptoms worsen  or if the condition fails to improve as anticipated   Cathlean Cower, MD

## 2019-06-19 NOTE — Patient Instructions (Signed)
Please take all new medication as prescribed - the gabapentin 300 mg at bedtime  Please continue all other medications as before, and refills have been done if requested.  Please have the pharmacy call with any other refills you may need.  Please continue your efforts at being more active, low cholesterol diet, and weight control  Please keep your appointments with your specialists as you may have planned

## 2019-07-28 ENCOUNTER — Encounter (HOSPITAL_COMMUNITY): Payer: Self-pay | Admitting: Emergency Medicine

## 2019-07-28 ENCOUNTER — Ambulatory Visit (HOSPITAL_COMMUNITY)
Admission: EM | Admit: 2019-07-28 | Discharge: 2019-07-28 | Disposition: A | Discharge status: Home / Self Care | Payer: BC Managed Care – PPO | Attending: Emergency Medicine | Admitting: Emergency Medicine

## 2019-07-28 ENCOUNTER — Other Ambulatory Visit: Payer: Self-pay

## 2019-07-28 DIAGNOSIS — H9202 Otalgia, left ear: Secondary | ICD-10-CM | POA: Diagnosis not present

## 2019-07-28 MED ORDER — IPRATROPIUM BROMIDE 0.06 % NA SOLN: 2.0000 | Freq: Two times a day (BID) | NASAL | 12 refills | Status: DC

## 2019-07-28 NOTE — ED Triage Notes (Signed)
Pt c/o L ear pain for the last few days.

## 2019-07-28 NOTE — Discharge Instructions (Signed)
Push fluids to ensure adequate hydration and keep secretions thin.  Tylenol and/or ibuprofen as needed for pain or fevers.  Continue with your daily allergy medication.  You may try prescribed nasal spray, 2-4 times a day to try to help with congestion to help with your discomfort.  If symptoms worsen or do not improve in the next week to return to be seen or to follow up with your PCP or ENT.

## 2019-07-29 NOTE — ED Provider Notes (Signed)
[place]    CSN: [id number] Arrival date & time: 07/28/19  1609      History   Chief Complaint Chief Complaint  Patient presents with  . Otalgia    HPI Kimberly Fernandez is a 43 y.o. female.   Kimberly Fernandez presents with complaints of left ear pain which has been ongoing for the past few days but was worse today. Burning sensation in ear. States history of sinus issues with congestion which is normal for her. Hearing is intermittently muffled. Denies any previous similar issues in a long time now. No drainage from the ear. No fevers. Occasional sore throat related to sinus drainage. Took sudafed this morning which didn't help. Takes xyzal regularly.    ROS per HPI, negative if not otherwise mentioned.      Past Medical History:  Diagnosis Date  . Albinism ([place])   . Anxiety   . Asthma   . Depression   . GERD (gastroesophageal reflux disease)   . Migraines   . Nystagmus   . Reflux     Patient Active Problem List   Diagnosis Date Noted  . RLS (restless legs syndrome) 06/19/2019  . Hyperglycemia 01/28/2019  . 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 Irregular menstruation 03/14/2017  . Encounter for general adult medical examination with abnormal findings 10/08/2015  . Depression with anxiety 02/24/2015  . Migraine 02/24/2015  . Medial tibial stress syndrome 12/01/2014  . Pancreatic lesion 11/04/2014  . Hiatal hernia 11/04/2014  . Shigella infection 11/04/2014  . Albinism ([place]) 02/23/2014  . PIH (pregnancy induced hypertension) 10/23/2012  . NSVD (normal spontaneous vaginal delivery) 10/23/2012  . Normal vaginal delivery 02/26/2011    Past Surgical History:  Procedure Laterality Date  . EYE SURGERY    . TOOTH EXTRACTION    . UPPER GASTROINTESTINAL ENDOSCOPY      OB History    Gravida  3   Para  3   Term  3   Preterm  0   AB  0   Living  3     SAB  0   TAB  0   Ectopic  0   Multiple  0   Live Births  3            Home  Medications    Prior to Admission medications   Medication Sig Start Date End Date Taking? Authorizing Provider  gabapentin (NEURONTIN) 300 MG capsule Take 1 capsule (300 mg total) by mouth at bedtime. 06/19/19   Biagio Borg, MD  ipratropium (ATROVENT) 0.06 % nasal spray Place 2 sprays into both nostrils 2 (two) times daily. 07/28/19   Zigmund Gottron, NP  sertraline (ZOLOFT) 100 MG tablet Take 1 tablet (100 mg total) by mouth daily. 01/28/19   Biagio Borg, MD  diphenhydramine-acetaminophen (TYLENOL PM) 25-500 MG TABS Take 1 tablet by mouth at bedtime as needed. Patient used medication for sleep.   01/28/12  [provider]    Family History Family History  Problem Relation Age of Onset  . Hypertension Father   . Cancer Father        lung  . Heart disease Maternal Grandmother   . Cancer Maternal Grandfather        lung  . Migraines Mother   . Migraines Son   . Diabetes Maternal Aunt   . Diabetes Maternal Uncle     Social History Social History   Tobacco Use  . Smoking status: Never Smoker  . Smokeless tobacco: Never  Used  Substance Use Topics  . Alcohol use: Yes    Alcohol/week: 0.0 standard drinks    Comment: occasional  . Drug use: No     Allergies   Doxycycline   Review of Systems Review of Systems   Physical Exam Triage Vital Signs ED Triage Vitals [07/28/19 1709]  Enc Vitals Group     BP 119/74     Pulse Rate (!) 50     Resp 18     Temp 98 F (36.7 C)     Temp src      SpO2 98 %     Weight      Height      Head Circumference      Peak Flow      Pain Score 4     Pain Loc      Pain Edu?      Excl. in GC?    No data found.  Updated Vital Signs BP 119/74   Pulse (!) 50   Temp 98 F (36.7 C)   Resp 18   LMP 06/28/2019   SpO2 98%   Visual Acuity Right Eye Distance:   Left Eye Distance:   Bilateral Distance:    Right Eye Near:   Left Eye Near:    Bilateral Near:     Physical Exam Constitutional:      General: She is not  in acute distress.    Appearance: She is well-developed.  HENT:     Right Ear: Tympanic membrane, ear canal and external ear normal.     Left Ear: Tympanic membrane, ear canal and external ear normal.  Cardiovascular:     Rate and Rhythm: Bradycardia present.  Pulmonary:     Effort: Pulmonary effort is normal.  Skin:    General: Skin is warm and dry.  Neurological:     Mental Status: She is alert and oriented to person, place, and time.      UC Treatments / Results  Labs (all labs ordered are listed, but only abnormal results are displayed) Labs Reviewed - No data to display  EKG   Radiology No results found.  Procedures Procedures (including critical care time)  Medications Ordered in UC Medications - No data to display  Initial Impression / Assessment and Plan / UC Course  I have reviewed the triage vital signs and the nursing notes.  Pertinent labs & imaging results that were available during my care of the patient were reviewed by me and considered in my medical decision making (see chart for details).     Non toxic. Benign physical exam. Suspect congestion symptoms contributing to ear pressure. Patient unable to tolerate flonase as it has elevated her BP in the past. Will trial atrovent to see if this helps with congestion to help with ear pain. Follow up with PCP and/or ent if persistent or no improvement. Patient verbalized understanding and agreeable to plan.   Final Clinical Impressions(s) / UC Diagnoses   Final diagnoses:  Otalgia of left ear     Discharge Instructions     Push fluids to ensure adequate hydration and keep secretions thin.  Tylenol and/or ibuprofen as needed for pain or fevers.  Continue with your daily allergy medication.  You may try prescribed nasal spray, 2-4 times a day to try to help with congestion to help with your discomfort.  If symptoms worsen or do not improve in the next week to return to be seen or to follow up with  your  PCP or ENT.     ED Prescriptions    Medication Sig Dispense Auth. Provider   ipratropium (ATROVENT) 0.06 % nasal spray Place 2 sprays into both nostrils 2 (two) times daily. 15 mL Georgetta Haber, NP     PDMP not reviewed this encounter.   Georgetta Haber, NP 07/29/19 [phone]

## 2019-08-20 ENCOUNTER — Telehealth: Payer: Self-pay

## 2019-08-20 ENCOUNTER — Encounter: Payer: Self-pay | Admitting: Internal Medicine

## 2019-08-20 MED ORDER — SUMATRIPTAN SUCCINATE 100 MG PO TABS: 100.0000 mg | ORAL_TABLET | ORAL | 2 refills | Status: DC | PRN

## 2019-08-20 NOTE — Telephone Encounter (Signed)
Ok for imitrex prn - done erx

## 2019-08-20 NOTE — Telephone Encounter (Signed)
Please advise this message 

## 2019-08-28 NOTE — Telephone Encounter (Signed)
A user error has taken place CLOSING ENCOUNTER 

## 2019-09-25 ENCOUNTER — Ambulatory Visit: Payer: BC Managed Care – PPO | Attending: Internal Medicine

## 2019-09-25 DIAGNOSIS — Z20822 Contact with and (suspected) exposure to covid-19: Secondary | ICD-10-CM

## 2019-09-26 LAB — NOVEL CORONAVIRUS, NAA: SARS-CoV-2, NAA: NOT DETECTED

## 2019-12-20 ENCOUNTER — Other Ambulatory Visit: Payer: Self-pay | Admitting: Internal Medicine

## 2020-03-04 ENCOUNTER — Encounter: Payer: Self-pay | Admitting: Internal Medicine

## 2020-03-04 NOTE — Telephone Encounter (Signed)
Ok for office visit to check on sugar and everything else

## 2020-04-01 ENCOUNTER — Ambulatory Visit (INDEPENDENT_AMBULATORY_CARE_PROVIDER_SITE_OTHER): Payer: BC Managed Care – PPO | Admitting: Internal Medicine

## 2020-04-01 ENCOUNTER — Encounter: Payer: Self-pay | Admitting: Internal Medicine

## 2020-04-01 ENCOUNTER — Other Ambulatory Visit: Payer: Self-pay

## 2020-04-01 VITALS — BP 124/78 | HR 50 | Temp 98.1°F | Ht 64.0 in | Wt 174.0 lb

## 2020-04-01 DIAGNOSIS — E559 Vitamin D deficiency, unspecified: Secondary | ICD-10-CM

## 2020-04-01 DIAGNOSIS — Z1159 Encounter for screening for other viral diseases: Secondary | ICD-10-CM

## 2020-04-01 DIAGNOSIS — R635 Abnormal weight gain: Secondary | ICD-10-CM

## 2020-04-01 DIAGNOSIS — F418 Other specified anxiety disorders: Secondary | ICD-10-CM

## 2020-04-01 DIAGNOSIS — R739 Hyperglycemia, unspecified: Secondary | ICD-10-CM

## 2020-04-01 DIAGNOSIS — E538 Deficiency of other specified B group vitamins: Secondary | ICD-10-CM | POA: Diagnosis not present

## 2020-04-01 DIAGNOSIS — Z0000 Encounter for general adult medical examination without abnormal findings: Secondary | ICD-10-CM | POA: Diagnosis not present

## 2020-04-01 NOTE — Patient Instructions (Signed)

## 2020-04-01 NOTE — Progress Notes (Signed)
Subjective:    Patient ID: Kimberly Fernandez, female    DOB: 10-23-1975, 44 y.o.   MRN: [id number]  HPI  Here for wellness and f/u;  Overall doing ok;  Pt denies Chest pain, worsening SOB, DOE, wheezing, orthopnea, PND, worsening LE edema, palpitations, dizziness or syncope.  Pt denies neurological change such as new headache, facial or extremity weakness.  Pt denies polydipsia, polyuria, or low sugar symptoms. Pt states overall good compliance with treatment and medications, good tolerability, and has been trying to follow appropriate diet.  Pt denies worsening depressive symptoms, suicidal ideation or panic. No fever, night sweats, wt loss, loss of appetite, or other constitutional symptoms.  Pt states good ability with ADL's, has low fall risk, home safety reviewed and adequate, no other significant changes in hearing or vision, and only occasionally active with exercise.  No new complaints except for ongoing wt gain. Past Medical History:  Diagnosis Date  . Albinism (HCC)   . Anxiety   . Asthma   . Depression   . GERD (gastroesophageal reflux disease)   . Migraines   . Nystagmus   . Reflux    Past Surgical History:  Procedure Laterality Date  . EYE SURGERY    . TOOTH EXTRACTION    . UPPER GASTROINTESTINAL ENDOSCOPY      reports that she has never smoked. She has never used smokeless tobacco. She reports current alcohol use. She reports that she does not use drugs. family history includes Cancer in her father and maternal grandfather; Diabetes in her maternal aunt and maternal uncle; Heart disease in her maternal grandmother; Hypertension in her father; Migraines in her mother and son. Allergies  Allergen Reactions  . Doxycycline Rash   Current Outpatient Medications on File Prior to Visit  Medication Sig Dispense Refill  . gabapentin (NEURONTIN) 300 MG capsule Take 1 capsule (300 mg total) by mouth at bedtime. Annual appt due in June must see provider for future refills 30 capsule  0  . ipratropium (ATROVENT) 0.06 % nasal spray Place 2 sprays into both nostrils 2 (two) times daily. 15 mL 12  . sertraline (ZOLOFT) 100 MG tablet Take 1 tablet (100 mg total) by mouth daily. 90 tablet 3  . SUMAtriptan (IMITREX) 100 MG tablet Take 1 tablet (100 mg total) by mouth every 2 (two) hours as needed for migraine or headache. May repeat in 2 hours if headache persists or recurs. 10 tablet 2  . [DISCONTINUED] diphenhydramine-acetaminophen (TYLENOL PM) 25-500 MG TABS Take 1 tablet by mouth at bedtime as needed. Patient used medication for sleep.      No current facility-administered medications on file prior to visit.   Review of Systems All otherwise neg per pt=    Objective:   Physical Exam BP 124/78   Pulse (!) 50   Temp 98.1 F (36.7 C) (Oral)   Ht 5\' 4"  (1.626 m)   Wt 174 lb (78.9 kg)   SpO2 98%   BMI 29.87 kg/m  VS noted,  Constitutional: Pt appears in NAD HENT: Head: NCAT.  Right Ear: External ear normal.  Left Ear: External ear normal.  Eyes: . Pupils are equal, round, and reactive to light. Conjunctivae and EOM are normal Nose: without d/c or deformity Neck: Neck supple. Gross normal ROM Cardiovascular: Normal rate and regular rhythm.   Pulmonary/Chest: Effort normal and breath sounds without rales or wheezing.  Abd:  Soft, NT, ND, + BS, no organomegaly Neurological: Pt is alert. At baseline orientation, motor  grossly intact Skin: Skin is warm. No rashes, other new lesions, no LE edema Psychiatric: Pt behavior is normal without agitation  All otherwise neg per pt Lab Results  Component Value Date   WBC 5.7 04/01/2020   HGB 13.5 04/01/2020   HCT 40.5 04/01/2020   PLT 182 04/01/2020   GLUCOSE 80 04/01/2020   CHOL 199 04/01/2020   TRIG 60 04/01/2020   HDL 65 04/01/2020   LDLCALC 119 (H) 04/01/2020   ALT 11 04/01/2020   AST 18 04/01/2020   NA 139 04/01/2020   K 4.0 04/01/2020   CL 103 04/01/2020   CREATININE 0.80 04/01/2020   BUN 15 04/01/2020   CO2  26 04/01/2020   TSH 0.90 04/01/2020   HGBA1C 4.7 04/01/2020      Assessment & Plan:

## 2020-04-02 ENCOUNTER — Encounter: Payer: Self-pay | Admitting: Internal Medicine

## 2020-04-02 LAB — COMPLETE METABOLIC PANEL WITH GFR
AG Ratio: 1.6 (calc) (ref 1.0–2.5)
ALT: 11 U/L (ref 6–29)
AST: 18 U/L (ref 10–30)
Albumin: 4.5 g/dL (ref 3.6–5.1)
Alkaline phosphatase (APISO): 81 U/L (ref 31–125)
BUN: 15 mg/dL (ref 7–25)
CO2: 26 mmol/L (ref 20–32)
Calcium: 9.9 mg/dL (ref 8.6–10.2)
Chloride: 103 mmol/L (ref 98–110)
Creat: 0.8 mg/dL (ref 0.50–1.10)
GFR, Est African American: 105 mL/min/{1.73_m2} (ref >=60)
GFR, Est Non African American: 91 mL/min/{1.73_m2} (ref >=60)
Globulin: 2.9 g/dL (calc) (ref 1.9–3.7)
Glucose, Bld: 80 mg/dL (ref 65–99)
Potassium: 4 mmol/L (ref 3.5–5.3)
Sodium: 139 mmol/L (ref 135–146)
Total Bilirubin: 0.6 mg/dL (ref 0.2–1.2)
Total Protein: 7.4 g/dL (ref 6.1–8.1)

## 2020-04-02 LAB — URINALYSIS, ROUTINE W REFLEX MICROSCOPIC
Bilirubin Urine: NEGATIVE
Glucose, UA: NEGATIVE
Hgb urine dipstick: NEGATIVE
Ketones, ur: NEGATIVE
Leukocytes,Ua: NEGATIVE
Nitrite: NEGATIVE
Protein, ur: NEGATIVE
Specific Gravity, Urine: 1.02 (ref 1.001–1.03)
pH: 5.5 (ref 5.0–8.0)

## 2020-04-02 LAB — HEMOGLOBIN A1C
Hgb A1c MFr Bld: 4.7 % of total Hgb (ref <5.7)
Mean Plasma Glucose: 88 (calc)
eAG (mmol/L): 4.9 (calc)

## 2020-04-02 LAB — VITAMIN D 25 HYDROXY (VIT D DEFICIENCY, FRACTURES): Vit D, 25-Hydroxy: 33 ng/mL (ref 30–100)

## 2020-04-02 LAB — CBC WITH DIFFERENTIAL/PLATELET
Absolute Monocytes: 342 cells/uL (ref 200–950)
Basophils Absolute: 23 cells/uL (ref 0–200)
Basophils Relative: 0.4 %
Eosinophils Absolute: 29 cells/uL (ref 15–500)
Eosinophils Relative: 0.5 %
HCT: 40.5 % (ref 35.0–45.0)
Hemoglobin: 13.5 g/dL (ref 11.7–15.5)
Lymphs Abs: 1881 cells/uL (ref 850–3900)
MCH: 28.4 pg (ref 27.0–33.0)
MCHC: 33.3 g/dL (ref 32.0–36.0)
MCV: 85.3 fL (ref 80.0–100.0)
MPV: 11.2 fL (ref 7.5–12.5)
Monocytes Relative: 6 %
Neutro Abs: 3426 cells/uL (ref 1500–7800)
Neutrophils Relative %: 60.1 %
Platelets: 182 10*3/uL (ref 140–400)
RBC: 4.75 10*6/uL (ref 3.80–5.10)
RDW: 12.9 % (ref 11.0–15.0)
Total Lymphocyte: 33 %
WBC: 5.7 10*3/uL (ref 3.8–10.8)

## 2020-04-02 LAB — LIPID PANEL
Cholesterol: 199 mg/dL (ref <200)
HDL: 65 mg/dL (ref >=50)
LDL Cholesterol (Calc): 119 mg/dL (calc) — ABNORMAL HIGH
Non-HDL Cholesterol (Calc): 134 mg/dL (calc) — ABNORMAL HIGH (ref <130)
Total CHOL/HDL Ratio: 3.1 (calc) (ref <5.0)
Triglycerides: 60 mg/dL (ref <150)

## 2020-04-02 LAB — HEPATITIS C ANTIBODY
Hepatitis C Ab: NONREACTIVE
SIGNAL TO CUT-OFF: 0.01 (ref <1.00)

## 2020-04-02 LAB — VITAMIN B12: Vitamin B-12: 530 pg/mL (ref 200–1100)

## 2020-04-02 LAB — TSH: TSH: 0.9 mIU/L

## 2020-04-04 ENCOUNTER — Encounter: Payer: Self-pay | Admitting: Internal Medicine

## 2020-04-04 NOTE — Assessment & Plan Note (Signed)
stable overall by history and exam, recent data reviewed with pt, and pt to continue medical treatment as before,  to f/u any worsening symptoms or concerns  

## 2020-04-04 NOTE — Assessment & Plan Note (Signed)

## 2020-08-11 ENCOUNTER — Telehealth: Payer: Self-pay | Admitting: Internal Medicine

## 2020-08-11 NOTE — Telephone Encounter (Signed)
Sent to Dr Jones

## 2020-08-11 NOTE — Telephone Encounter (Signed)
[facility]   Caller states, has blood pressure at 146/94 @0800 . Every time she takes Ibuprofen. BP goes up and gets headache. She is taking it for knee pain. Her pain is achy and swollen. Tylenol does not help.  [facility] advised: See PCP within 24 hours.   Please advise. The providers here are currently have full schedules.

## 2020-08-11 NOTE — Telephone Encounter (Signed)
Patient called and said she took ibuprofen for knee pain and her bp is now 146/94 she said before taking the medication her bp was 128/80. She also said that she has a severe migraine. Transferred to [facility].

## 2020-08-12 ENCOUNTER — Encounter: Payer: Self-pay | Admitting: Family Medicine

## 2020-08-13 NOTE — Progress Notes (Signed)
Subjective:    CC: R knee pain  I, Kimberly Fernandez, LAT, ATC, am serving as scribe for Dr. Clementeen Fernandez.  HPI: Pt is a 44 y/o female presenting w/ c/o R knee pain x 2 months.  She locates her pain to her R anterior knee just proximal and distal to her patella.  She also has pain in multiple other joints and is interested in making sure she doesn't have something else going on like RA.  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  She also general body aches including contralateral left knee shoulder hip and some muscle pain.  She wants to make sure that she does not have a rheumatologic problem as well.  R knee swelling: yes R knee mechanical symptoms: yes Aggravating factors: R knee flexion; climbing stairs; lateral movements Treatments tried: IBU; prednisone; compression sleeve  Diagnostic testing: R knee XR- 08/11/20 at Urgent Care in [facility]  Pertinent review of Systems: No fevers or chills  Relevant historical information: Significant for albinism with some visual impairment.  She does not drive but can read.   Objective:    Vitals:   08/17/20 0752  BP: 134/90  Pulse: 64  SpO2: 98%   General: Well Developed, well nourished, and in no acute distress.   MSK: Right knee normal-appearing Normal motion with crepitation. Mildly tender palpation medial joint line. Stable ligamentous exam. Mildly positive medial McMurray's test. Intact strength.  Lab and Radiology Results  Diagnostic Limited MSK Ultrasound of: Right knee Quad tendon intact normal-appearing Patellar tendon normal-appearing Lateral joint line slight osteophyte but otherwise normal-appearing Medial joint line narrowed with degenerative appearing medial meniscus. Posterior knee no Baker's cyst. 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      Impression and Recommendations:    Assessment and Plan: 44 y.o. female with right knee pain thought to be  due to patellofemoral DJD as well as some degenerative changes at the medial joint line and meniscus. Discussed options.  She currently is taking an oral steroid Dosepak as prescribed by urgent care..  Plan to work on Dance movement psychotherapist.  Recommend Voltaren gel.  Also recommend steroid injection in the future if needed or even hyaluronic acid injection in future.  Work on authorization now of hyaluronic acid injections.  Additionally we will proceed with rheumatologic assessment to evaluate for possible rheumatologic disease.  Recheck back as needed.  PDMP not reviewed this encounter. Orders Placed This Encounter  Procedures  . [place] LIMITED JOINT SPACE STRUCTURES LOW RIGHT(NO LINKED CHARGES)    Order Specific Question:   Reason for Exam (SYMPTOM  OR DIAGNOSIS REQUIRED)    Answer:   R ant knee pain    Order Specific Question:   Preferred imaging location?    Answer:   Adult nurse [facility]  . Sedimentation rate    Standing Status:   Future    Number of Occurrences:   1    Standing Expiration Date:   08/17/2021  . Rheumatoid factor    Standing Status:   Future    Number of Occurrences:   1    Standing Expiration Date:   08/17/2021  . ANA    Standing Status:   Future    Number of Occurrences:   1    Standing Expiration Date:   08/17/2021  . CK    Standing Status:   Future    Number of Occurrences:   1    Standing Expiration  Date:   08/17/2021  . Uric acid    Standing Status:   Future    Number of Occurrences:   1    Standing Expiration Date:   08/17/2021   No orders of the defined types were placed in this encounter.   Discussed warning signs or symptoms. Please see discharge instructions. Patient expresses understanding.   The above documentation has been reviewed and is accurate and complete Kimberly Fernandez, M.D.

## 2020-08-17 ENCOUNTER — Other Ambulatory Visit: Payer: Self-pay

## 2020-08-17 ENCOUNTER — Ambulatory Visit: Payer: BC Managed Care – PPO | Admitting: Family Medicine

## 2020-08-17 ENCOUNTER — Encounter: Payer: Self-pay | Admitting: Family Medicine

## 2020-08-17 ENCOUNTER — Ambulatory Visit: Payer: Self-pay

## 2020-08-17 VITALS — BP 134/90 | HR 64 | Ht 64.0 in | Wt 174.6 lb

## 2020-08-17 DIAGNOSIS — M25561 Pain in right knee: Secondary | ICD-10-CM | POA: Diagnosis not present

## 2020-08-17 DIAGNOSIS — R768 Other specified abnormal immunological findings in serum: Secondary | ICD-10-CM | POA: Diagnosis not present

## 2020-08-17 DIAGNOSIS — M2550 Pain in unspecified joint: Secondary | ICD-10-CM | POA: Diagnosis not present

## 2020-08-17 LAB — URIC ACID: Uric Acid, Serum: 4.5 mg/dL (ref 2.4–7.0)

## 2020-08-17 LAB — SEDIMENTATION RATE: Sed Rate: 13 mm/hr (ref 0–20)

## 2020-08-17 LAB — CK: Total CK: 43 U/L (ref 7–177)

## 2020-08-17 NOTE — Patient Instructions (Signed)
Thank you for coming in today.  Please get labs today before you leave  I recommend you obtained a compression sleeve to help with your joint problems. There are many options on the market however I recommend obtaining a full knee Body Helix compression sleeve.  You can find information (including how to appropriate measure yourself for sizing) can be found at [email or web address].ch.  Many of these products are health savings account (HSA) eligible.   You can use the compression sleeve at any time throughout the day but is most important to use while being active as well as for 2 hours post-activity.   It is appropriate to ice following activity with the compression sleeve in place.  Please use voltaren gel up to 4x daily for pain as needed.   I will work on [employer].   Let me know if you want a steroid injection in the future.   Recheck as needed.

## 2020-08-17 NOTE — Progress Notes (Signed)
Initial labs are normal.  Uric acid a measurement for gout is normal.  CK a measurement for muscle inflammation is normal.  Sedimentation rate a marker of generalized inflammation is normal.  Other labs are still pending.

## 2020-08-19 ENCOUNTER — Encounter: Payer: Self-pay | Admitting: Family Medicine

## 2020-08-19 LAB — RHEUMATOID FACTOR: Rheumatoid fact SerPl-aCnc: <14 IU/mL (ref <14)

## 2020-08-19 LAB — ANTI-NUCLEAR AB-TITER (ANA TITER): ANA Titer 1: 1:160 {titer} — ABNORMAL HIGH

## 2020-08-19 LAB — ANA: Anti Nuclear Antibody (ANA): POSITIVE — AB

## 2020-08-19 NOTE — Addendum Note (Signed)
Addended by: Rodolph Bong on: 08/19/2020 04:29 PM   Modules accepted: Orders

## 2020-08-19 NOTE — Progress Notes (Signed)
ANA is elevated with an elevated titer of 1: 160.  However other labs were negative.  This indicates that you may have a rheumatologic disease but I am not sure.  In situations like this I think it is worthwhile a referral to a rheumatologist to help sort through the next testing options.  I have placed a referral to a rheumatologist.  If they cannot get to you in the near future please let me know there are other options in this community.

## 2020-09-23 NOTE — Progress Notes (Signed)
Office Visit Note  Patient: Kimberly Fernandez             Date of Birth: Jun 24, 1976           MRN: [id number]             PCP: Biagio Borg, MD Referring: Gregor Hams, MD Visit Date: 09/24/2020 Occupation: [employer]  Subjective:  New Patient (Initial Visit) (Patient complains of left ankle, left shoulder, right wrist, and bilateral knee stiffness and pain. Patient also complains of lower back tightness and tension. )   History of Present Illness: Kimberly Fernandez is a 45 y.o. female with a history of albinism, migraines, RLS, anxiety here for evaluation of positive ANA checked in evaluation of arthralgias. She has experienced pain affecting her left ankle, left shoulder, right wrist, low back, and both knees. This is persistent for years but with progressive worsening lately. In particular the knees are problematic as she has had to avoid impactful exercises and activities. Joint stiffness is present throughout the day. Usually no large swelling visible. She has noticed development of erythematous skin patches these are not painful or itchy and resolve without bruising. No new lymphadenopathy, oral ulcers, pleurisy, raynaud's, and no history of blood clots.  Labs reviewed 08/2020 ESR 13 CK 43 Uric acid 4.5 ANA 1:160 Dense Fine Speckled RF 14  03/2020 TSH 0.90 Vit D 33  Imaging reviewed Xray R knee (report only) 07/2020 Medial and patellofemoral compartment osteoarthritis  Activities of Daily Living:  Patient reports morning stiffness for 24 hours.   Patient Reports nocturnal pain.  Difficulty dressing/grooming: Reports Difficulty climbing stairs: Reports Difficulty getting out of chair: Reports Difficulty using hands for taps, buttons, cutlery, and/or writing: Reports  Review of Systems  Constitutional: Positive for fatigue.  HENT: Positive for mouth dryness and nose dryness. Negative for mouth sores.   Eyes: Positive for dryness. Negative for pain, itching and  visual disturbance.  Respiratory: Negative for cough, hemoptysis, shortness of breath and difficulty breathing.   Cardiovascular: Negative for chest pain, palpitations and swelling in legs/feet.  Gastrointestinal: Positive for constipation. Negative for abdominal pain, blood in stool and diarrhea.  Endocrine: Negative for increased urination.  Genitourinary: Negative for painful urination.  Musculoskeletal: Positive for arthralgias, joint pain, joint swelling, myalgias, muscle weakness, morning stiffness, muscle tenderness and myalgias.  Skin: Negative for color change, rash and redness.  Allergic/Immunologic: Negative for susceptible to infections.  Neurological: Positive for headaches, parasthesias, memory loss and weakness. Negative for dizziness and numbness.  Hematological: Negative for swollen glands.  Psychiatric/Behavioral: Positive for confusion and sleep disturbance.    PMFS History:  Patient Active Problem List   Diagnosis Date Noted  . Positive ANA (antinuclear antibody) 09/24/2020  . Polyarthralgia 09/24/2020  . Rash and other nonspecific skin eruption 09/24/2020  . RLS (restless legs syndrome) 06/19/2019  . Hyperglycemia 01/28/2019  . 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 Irregular menstruation 03/14/2017  . Preventative health care 10/08/2015  . Depression with anxiety 02/24/2015  . Migraine 02/24/2015  . Medial tibial stress syndrome 12/01/2014  . Pancreatic lesion 11/04/2014  . Hiatal hernia 11/04/2014  . Shigella infection 11/04/2014  . Albinism ([place]) 02/23/2014  . PIH (pregnancy induced hypertension) 10/23/2012  . NSVD (normal spontaneous vaginal delivery) 10/23/2012  . Normal vaginal delivery 02/26/2011    Past Medical History:  Diagnosis Date  . Albinism ([place])   . Anxiety   . Asthma   . Depression   . GERD (gastroesophageal reflux disease)   .  Migraines   . Nystagmus   . Reflux     Family History  Problem Relation Age of Onset  . Hypertension Father    . Cancer Father        lung  . Heart disease Maternal Grandmother   . Cancer Maternal Grandfather        lung  . Migraines Mother   . Diabetes Mother   . Migraines Son   . ADD / ADHD Daughter   . Anxiety disorder Daughter   . Diabetes Maternal Aunt   . Rheum arthritis Maternal Aunt   . Diabetes Maternal Uncle    Past Surgical History:  Procedure Laterality Date  . EYE SURGERY    . TOOTH EXTRACTION    . UPPER GASTROINTESTINAL ENDOSCOPY     Social History   Social History Narrative   Born and raised in [place], [place]. Fun: run, crochet   Denies religious beliefs that would effect health care.    Immunization History  Administered Date(s) Administered  . Influenza,inj,Quad PF,6+ Mos 06/16/2015, 05/31/2016, 05/05/2018  . Influenza-Unspecified 05/24/2019  . Janssen (J&J) SARS-COV-2 Vaccination 10/24/2019  . PFIZER(Purple Top)SARS-COV-2 Vaccination 06/27/2020  . Tdap 02/26/2011     Objective: Vital Signs: BP 121/78 (BP Location: Right Arm, Patient Position: Sitting, Cuff Size: Normal)   Pulse (!) 59   Ht 5' 4.25" (1.632 m)   Wt 167 lb 9.6 oz (76 kg)   BMI 28.55 kg/m    Physical Exam HENT:     Right Ear: External ear normal.     Left Ear: External ear normal.     Mouth/Throat:     Mouth: Mucous membranes are moist.     Pharynx: Oropharynx is clear.  Eyes:     Conjunctiva/sclera: Conjunctivae normal.     Comments: Eye tremor or irregular saccades present  Cardiovascular:     Rate and Rhythm: Normal rate and regular rhythm.  Pulmonary:     Effort: Pulmonary effort is normal.     Breath sounds: Normal breath sounds.  Skin:    General: Skin is warm and dry.     Comments: Albinism  Neurological:     General: No focal deficit present.     Mental Status: She is alert.  Psychiatric:        Mood and Affect: Mood normal.     Musculoskeletal Exam:  Neck full ROM no tenderness Shoulders full ROM some pain with ROM no swelling no tenderness to palpation Elbows  full ROM no tenderness or swelling Wrists full ROM no tenderness or swelling Fingers full ROM no tenderness or swelling Bilateral lumbar paraspinal tenderness to palpation Knees full ROM patellofemoral crepitus present b/l medial joint line tenderness to pressure, negative patellar grind test Ankles full ROM no tenderness or swelling  Investigation: No additional findings.  Imaging: No results found.  Recent Labs: Lab Results  Component Value Date   WBC 5.7 04/01/2020   HGB 13.5 04/01/2020   PLT 182 04/01/2020   NA 139 04/01/2020   K 4.0 04/01/2020   CL 103 04/01/2020   CO2 26 04/01/2020   GLUCOSE 80 04/01/2020   BUN 15 04/01/2020   CREATININE 0.80 04/01/2020   BILITOT 0.6 04/01/2020   ALKPHOS 54 10/17/2017   AST 18 04/01/2020   ALT 11 04/01/2020   PROT 7.4 04/01/2020   ALBUMIN 4.4 10/17/2017   CALCIUM 9.9 04/01/2020   GFRAA 105 04/01/2020    Speciality Comments: No specialty comments available.  Procedures:  No procedures performed  Allergies: Doxycycline, Aspirin, and Ibuprofen   Assessment / Plan:     Visit Diagnoses: Positive ANA (antinuclear antibody) - Plan: Anti-Smith antibody, Sjogrens syndrome-A extractable nuclear antibody, Sjogrens syndrome-B extractable nuclear antibody, Anti-DNA antibody, double-stranded, C3 and C4  Positive ANA and diffuse arthralgias without any specific inflammatory changes seen on exam today. DFS is a pattern often seen without known systemic disease association so currently suspect false/insignificant positive. Will check more specific Ab tests SSA, SSB, dsDNA, smith, complements if all negative would not further workup without clinical evidence.  Polyarthralgia  Joint pains seem consistent with OA especially bilateral knees with medial joint tenderness to pressure b/l. If no inflammatory process suggested by above workup, would f/u with established sports med for OA management.  Orders: Orders Placed This Encounter  Procedures   . Anti-Smith antibody  . Sjogrens syndrome-A extractable nuclear antibody  . Sjogrens syndrome-B extractable nuclear antibody  . Anti-DNA antibody, double-stranded  . C3 and C4   No orders of the defined types were placed in this encounter.   Follow-Up Instructions: No follow-ups on file.   Collier Salina, MD  Note - This record has been created using [employer].  Chart creation errors have been sought, but may not always  have been located. Such creation errors do not reflect on  the standard of medical care.

## 2020-09-24 ENCOUNTER — Other Ambulatory Visit: Payer: Self-pay

## 2020-09-24 ENCOUNTER — Ambulatory Visit: Payer: BC Managed Care – PPO | Admitting: Internal Medicine

## 2020-09-24 ENCOUNTER — Encounter: Payer: Self-pay | Admitting: Internal Medicine

## 2020-09-24 VITALS — BP 121/78 | HR 59 | Ht 64.25 in | Wt 167.6 lb

## 2020-09-24 DIAGNOSIS — R768 Other specified abnormal immunological findings in serum: Secondary | ICD-10-CM | POA: Insufficient documentation

## 2020-09-24 DIAGNOSIS — R21 Rash and other nonspecific skin eruption: Secondary | ICD-10-CM

## 2020-09-24 DIAGNOSIS — M2550 Pain in unspecified joint: Secondary | ICD-10-CM | POA: Insufficient documentation

## 2020-09-25 ENCOUNTER — Encounter: Payer: Self-pay | Admitting: Family Medicine

## 2020-09-25 LAB — C3 AND C4
C3 Complement: 130 mg/dL (ref 83–193)
C4 Complement: 31 mg/dL (ref 15–57)

## 2020-09-25 LAB — SJOGRENS SYNDROME-B EXTRACTABLE NUCLEAR ANTIBODY: SSB (La) (ENA) Antibody, IgG: <1 AI

## 2020-09-25 LAB — SJOGRENS SYNDROME-A EXTRACTABLE NUCLEAR ANTIBODY: SSA (Ro) (ENA) Antibody, IgG: <1 AI

## 2020-09-25 LAB — ANTI-SMITH ANTIBODY: ENA SM Ab Ser-aCnc: <1 AI

## 2020-09-25 LAB — ANTI-DNA ANTIBODY, DOUBLE-STRANDED: ds DNA Ab: <1 IU/mL

## 2020-09-27 NOTE — Progress Notes (Signed)
Lab tests more specific to lupus or sjogren's syndrome are all negative. With no obvious inflammation seen in our visit I think this pretty much rules out this as a cause of current symptoms. There was significant osteoarthritis for her age on the imaging, and do recommend she keep following up with sports medicine or orthopedics for this.

## 2020-10-29 ENCOUNTER — Other Ambulatory Visit: Payer: Self-pay

## 2020-10-29 ENCOUNTER — Encounter: Payer: Self-pay | Admitting: Family Medicine

## 2020-10-29 ENCOUNTER — Ambulatory Visit (INDEPENDENT_AMBULATORY_CARE_PROVIDER_SITE_OTHER): Payer: BC Managed Care – PPO | Admitting: Family Medicine

## 2020-10-29 ENCOUNTER — Ambulatory Visit: Payer: Self-pay

## 2020-10-29 ENCOUNTER — Ambulatory Visit (INDEPENDENT_AMBULATORY_CARE_PROVIDER_SITE_OTHER): Payer: BC Managed Care – PPO

## 2020-10-29 VITALS — BP 102/70 | HR 59 | Ht 64.25 in | Wt 166.0 lb

## 2020-10-29 DIAGNOSIS — M25562 Pain in left knee: Secondary | ICD-10-CM

## 2020-10-29 NOTE — Patient Instructions (Signed)
Thank you for coming in today.  Please get an Xray today before you leave  Call or go to the ER if you develop a large red swollen joint with extreme pain or oozing puss.   Keep me updated.   Recheck as needed.   The gel shot authorization should be valid for 6 months.

## 2020-10-29 NOTE — Progress Notes (Signed)
Kimberly Fernandez, am serving as a Neurosurgeon for Dr. Clementeen Fernandez.  Kimberly Fernandez is a 45 y.o. female who presents to [facility] today for L knee pain ongoing with bad pain for a week but has been bothering her since she was here last for her right knee. Pt locates L knee pain to Left medial knee pain and back of knee. 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 Pt was last seen by Dr. Denyse Fernandez on 08/17/20 for R knee pain and was advised to work on quad strengthening and use Voltaren gel. Pt was also referred for a rheumatologic assessment. Today, pt reports that she is still able to walk on the left leg that it has not gotten as bad as her right knee but didn't wanted to get it looked at right away.   Dx testing: 09/24/20 C3 & C4, Anti-DNA, Sjogrens syndrome-A & B, and Anti-Smith  08/17/20 Sedimentation rate, CK, Uric acid, ANA, and Rheumatoid factor  08/11/20 R knee XR (at Urgent Care in [place])  Pertinent review of systems: No fevers or chills  Relevant historical information: Kimberly Fernandez.  Positive ANA thought not to be rheumatological related   Exam:  BP 102/70 (BP Location: Left Arm, Patient Position: Sitting, Cuff Size: Normal)   Pulse (!) 59   Ht 5' 4.25" (1.632 m)   Wt 166 lb (75.3 kg)   SpO2 99%   BMI 28.27 kg/m  General: Well Developed, well nourished, and in no acute distress.   MSK: Left knee minimal effusion otherwise normal-appearing Normal motion without crepitation.  Tender palpation medial joint line. Stable ligamentous exam.    Lab and Radiology Results  X-ray images left knee obtained today personally and independently interpreted Normal medial compartment DJD.  No acute fractures. Await formal radiology review  Procedure: Real-time Ultrasound Guided Injection of left knee superior lateral patellar space Device: Philips Affiniti 50G Images permanently stored and available for review in PACS Verbal informed consent  obtained.  Discussed risks and benefits of procedure. Warned about infection bleeding damage to structures skin hypopigmentation and fat atrophy among others. Patient expresses understanding and agreement Time-out conducted.   Noted no overlying erythema, induration, or other signs of local infection.   Skin prepped in a sterile fashion.   Local anesthesia: Topical Ethyl chloride.   With sterile technique and under real time ultrasound guidance:  40 mg of Kenalog and 2 mL of Marcaine injected into knee joint. Fluid seen entering the joint capsule.   Completed without difficulty   Pain immediately resolved suggesting accurate placement of the medication.   Advised to call if fevers/chills, erythema, induration, drainage, or persistent bleeding.   Images permanently stored and available for review in the ultrasound unit.  Impression: Technically successful ultrasound guided injection.         Assessment and Plan: 45 y.o. female with left knee pain.  Minimal DJD.  Pain also likely secondary to overuse as she is trying to offload her right knee as it has been hurting recently as well.  Plan for steroid injection and continued strengthening.  Hyaluronic acid series is also been authorized but will hold off on that for now.  Recheck back as needed.   PDMP not reviewed this encounter. Orders Placed This Encounter  Procedures  . [place] LIMITED JOINT SPACE STRUCTURES LOW LEFT(NO LINKED CHARGES)    Standing Status:   Future    Number of Occurrences:   1  Standing Expiration Date:   10/29/2021    Order Specific Question:   Reason for Exam (SYMPTOM  OR DIAGNOSIS REQUIRED)    Answer:   Left knee pain    Order Specific Question:   Preferred imaging location?    Answer:   Internal  . DG Knee AP/LAT W/Sun[place] Left    Standing Status:   Future    Number of Occurrences:   1    Standing Expiration Date:   10/29/2021    Order Specific Question:   Reason for Exam (SYMPTOM  OR DIAGNOSIS REQUIRED)     Answer:   left knee inj    Order Specific Question:   Is patient pregnant?    Answer:   No    Order Specific Question:   Preferred imaging location?    Answer:   [facility]   No orders of the defined types were placed in this encounter.    Discussed warning signs or symptoms. Please see discharge instructions. Patient expresses understanding.   The above documentation has been reviewed and is accurate and complete Kimberly Fernandez, M.D.

## 2020-11-02 NOTE — Progress Notes (Signed)
Left knee x-ray looks normal to radiology

## 2021-03-25 ENCOUNTER — Emergency Department (HOSPITAL_COMMUNITY): Payer: BC Managed Care – PPO

## 2021-03-25 ENCOUNTER — Emergency Department (HOSPITAL_BASED_OUTPATIENT_CLINIC_OR_DEPARTMENT_OTHER)
Admission: EM | Admit: 2021-03-25 | Discharge: 2021-03-25 | Disposition: A | Discharge status: Home / Self Care | Payer: BC Managed Care – PPO | Attending: Emergency Medicine | Admitting: Emergency Medicine

## 2021-03-25 ENCOUNTER — Encounter (HOSPITAL_BASED_OUTPATIENT_CLINIC_OR_DEPARTMENT_OTHER): Payer: Self-pay | Admitting: Emergency Medicine

## 2021-03-25 ENCOUNTER — Other Ambulatory Visit: Payer: Self-pay

## 2021-03-25 ENCOUNTER — Emergency Department (HOSPITAL_BASED_OUTPATIENT_CLINIC_OR_DEPARTMENT_OTHER): Payer: BC Managed Care – PPO

## 2021-03-25 DIAGNOSIS — R202 Paresthesia of skin: Secondary | ICD-10-CM

## 2021-03-25 DIAGNOSIS — J45909 Unspecified asthma, uncomplicated: Secondary | ICD-10-CM | POA: Diagnosis not present

## 2021-03-25 DIAGNOSIS — G43109 Migraine with aura, not intractable, without status migrainosus: Secondary | ICD-10-CM

## 2021-03-25 DIAGNOSIS — G43809 Other migraine, not intractable, without status migrainosus: Secondary | ICD-10-CM | POA: Insufficient documentation

## 2021-03-25 LAB — COMPREHENSIVE METABOLIC PANEL
ALT: 9 U/L (ref 0–44)
AST: 14 U/L — ABNORMAL LOW (ref 15–41)
Albumin: 4.2 g/dL (ref 3.5–5.0)
Alkaline Phosphatase: 61 U/L (ref 38–126)
Anion gap: 10 (ref 5–15)
BUN: 15 mg/dL (ref 6–20)
CO2: 25 mmol/L (ref 22–32)
Calcium: 9.3 mg/dL (ref 8.9–10.3)
Chloride: 106 mmol/L (ref 98–111)
Creatinine, Ser: 0.61 mg/dL (ref 0.44–1.00)
GFR, Estimated: >60 mL/min (ref >60)
Glucose, Bld: 114 mg/dL — ABNORMAL HIGH (ref 70–99)
Potassium: 3.5 mmol/L (ref 3.5–5.1)
Sodium: 141 mmol/L (ref 135–145)
Total Bilirubin: 0.6 mg/dL (ref 0.3–1.2)
Total Protein: 7.5 g/dL (ref 6.5–8.1)

## 2021-03-25 LAB — MAGNESIUM: Magnesium: 2 mg/dL (ref 1.7–2.4)

## 2021-03-25 LAB — CBC
HCT: 42.5 % (ref 36.0–46.0)
Hemoglobin: 14.7 g/dL (ref 12.0–15.0)
MCH: 29.4 pg (ref 26.0–34.0)
MCHC: 34.6 g/dL (ref 30.0–36.0)
MCV: 85 fL (ref 80.0–100.0)
Platelets: 161 10*3/uL (ref 150–400)
RBC: 5 MIL/uL (ref 3.87–5.11)
RDW: 12.7 % (ref 11.5–15.5)
WBC: 4.6 10*3/uL (ref 4.0–10.5)
nRBC: 0 % (ref 0.0–0.2)

## 2021-03-25 LAB — CBG MONITORING, ED: Glucose-Capillary: 115 mg/dL — ABNORMAL HIGH (ref 70–99)

## 2021-03-25 MED ORDER — SODIUM CHLORIDE 0.9 % IV BOLUS
1000.0000 mL | Freq: Once | INTRAVENOUS | Status: AC
  Administered 2021-03-25: 1000 mL via INTRAVENOUS

## 2021-03-25 NOTE — Discharge Instructions (Addendum)
Follow up with your Physician  for recheck next week  

## 2021-03-25 NOTE — ED Notes (Signed)
Back from Ct.

## 2021-03-25 NOTE — ED Provider Notes (Signed)
[facility] EMERGENCY DEPT Provider Note   CSN: [id number] Arrival date & time: 03/25/21  1125     History Chief Complaint  Patient presents with   Numbness   Tingling    Kimberly Fernandez is a 45 y.o. female.  HPI 45 year old female presents with right-sided numbness and tingling.  Started 2 days ago.  She developed COVID recently, she estimates her symptoms started on 8/2.  She had multiple symptoms but including and this were a migraine headache that lasted way longer than typical.  Lasted until 2 days ago.  She estimates maybe an hour later she developed these new numbness/tingling sensations.  When she was a shoe on it feels like that she was on too tight on her foot and there is like a crawling sensation and tingling in her arm and right cheek/face/head.  No further headaches.  No weakness though she does feel her arm tires out easier.  No further fevers now that the COVID symptoms have improved.  Thought the symptoms might go away but they did not.  She has chronic nystagmus that is unchanged.  She felt dizzy once when she turned her head yesterday but none since.  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/21/2017   Irregular menstruation 03/14/2017   Preventative health care 10/08/2015   Depression with anxiety 02/24/2015   Migraine 02/24/2015   Medial tibial stress syndrome 12/01/2014   Pancreatic lesion 11/04/2014   Hiatal hernia 11/04/2014   Shigella infection 11/04/2014   Albinism (HCC) 02/23/2014   PIH (pregnancy induced hypertension)  10/23/2012   NSVD (normal spontaneous vaginal delivery) 10/23/2012   Normal vaginal delivery 02/26/2011    Past Surgical History:  Procedure Laterality Date   EYE SURGERY     TOOTH EXTRACTION     UPPER GASTROINTESTINAL ENDOSCOPY       OB History     Gravida  3   Para  3   Term  3   Preterm  0   AB  0   Living  3      SAB  0   IAB  0   Ectopic  0   Multiple  0   Live Births  3           Family History  Problem Relation Age of Onset   Hypertension Father    Cancer Father        lung   Heart disease Maternal Grandmother    Cancer Maternal Grandfather        lung   Migraines Mother    Diabetes Mother    Migraines Son    ADD / ADHD Daughter    Anxiety disorder Daughter    Diabetes Maternal Aunt    Rheum arthritis Maternal Aunt    Diabetes Maternal Uncle     Social History   Tobacco Use   Smoking status: Never   Smokeless tobacco: Never  Vaping Use   Vaping Use: Never used  Substance Use Topics   Alcohol use: Yes  Alcohol/week: 0.0 standard drinks    Comment: occasional   Drug use: No    Home Medications Prior to Admission medications   Medication Sig Start Date End Date Taking? Authorizing Provider  Probiotic Product (PROBIOTIC PO) Take by mouth daily.    [provider]  diphenhydramine-acetaminophen (TYLENOL PM) 25-500 MG TABS Take 1 tablet by mouth at bedtime as needed. Patient used medication for sleep.   01/28/12  [provider]    Allergies    Doxycycline, Aspirin, and Ibuprofen  Review of Systems   Review of Systems  Constitutional:  Negative for fever (resolved).  Neurological:  Positive for numbness. Negative for weakness and headaches (resolved).  All other systems reviewed and are negative.  Physical Exam Updated Vital Signs BP 105/70 (BP Location: Right Arm)   Pulse (!) 54   Temp 97.7 F (36.5 C) (Oral)   Resp 16   Ht 5\' 4"  (1.626 m)   Wt 68.5 kg   SpO2 99%   BMI 25.92 kg/m   Physical  Exam Vitals and nursing note reviewed.  Constitutional:      General: She is not in acute distress.    Appearance: She is well-developed. She is not ill-appearing.  HENT:     Head: Normocephalic and atraumatic.     Right Ear: External ear normal.     Left Ear: External ear normal.     Nose: Nose normal.  Eyes:     General:        Right eye: No discharge.        Left eye: No discharge.     Extraocular Movements: Extraocular movements intact.     Pupils: Pupils are equal, round, and reactive to light.     Comments: Bilateral nystagmus  Cardiovascular:     Rate and Rhythm: Normal rate and regular rhythm.     Heart sounds: Normal heart sounds.  Pulmonary:     Effort: Pulmonary effort is normal.     Breath sounds: Normal breath sounds.  Abdominal:     Palpations: Abdomen is soft.     Tenderness: There is no abdominal tenderness.  Skin:    General: Skin is warm and dry.  Neurological:     Mental Status: She is alert.     Comments: CN 3-12 grossly intact. 5/5 strength in all 4 extremities. Grossly normal sensation in left upper extremities.  When touching her right face, right arm, right leg, she feels these sensations are different.  She can feel me but feels like something is crawling on her skin. Normal finger to nose.   Psychiatric:        Mood and Affect: Mood is not anxious.    ED Results / Procedures / Treatments   Labs (all labs ordered are listed, but only abnormal results are displayed) Labs Reviewed  COMPREHENSIVE METABOLIC PANEL - Abnormal; Notable for the following components:      Result Value   Glucose, Bld 114 (*)    AST 14 (*)    All other components within normal limits  CBG MONITORING, ED - Abnormal; Notable for the following components:   Glucose-Capillary 115 (*)    All other components within normal limits  CBC  MAGNESIUM  URINALYSIS, ROUTINE W REFLEX MICROSCOPIC  PREGNANCY, URINE    EKG EKG Interpretation  Date/Time:  Thursday March 25 2021  11:45:35 EDT Ventricular Rate:  57 PR Interval:  177 QRS Duration: 99 QT Interval:  428 QTC Calculation: 417 R Axis:  58 Text Interpretation: Sinus rhythm no acute ST/T changes No old tracing to compare Confirmed by Pricilla Loveless [phone]) on 03/25/2021 12:32:14 PM  Radiology CT HEAD WO CONTRAST ( )  Result Date: 03/25/2021 CLINICAL DATA:  Neuro deficit, acute, stroke suspected. Additional provided: Patient reports numbness and tingling to right side of body, patient reports testing positive for COVID August 5th. EXAM: CT HEAD WITHOUT CONTRAST TECHNIQUE: Contiguous axial images were obtained from the base of the skull through the vertex without intravenous contrast. COMPARISON:  Brain MRI 09/19/2014. FINDINGS: Brain: Cerebral volume is normal. Redemonstrated cerebellar tonsillar ectopia. There is no acute intracranial hemorrhage. No demarcated cortical infarct. No extra-axial fluid collection. No evidence of an intracranial mass. No midline shift. Vascular: No hyperdense vessel. Skull: Normal. Negative for fracture or focal lesion. Sinuses/Orbits: Visualized orbits show no acute finding. Mild mucosal thickening within the left frontal, bilateral ethmoid and right sphenoid sinuses at the imaged levels. IMPRESSION: No evidence of acute intracranial abnormality. Known cerebellar tonsillar ectopia, more fully evaluated on the prior brain MRI of 09/19/2014. Mild paranasal sinus mucosal thickening at the imaged levels. Electronically Signed   By: Jackey Loge DO   On: 03/25/2021 12:28    Procedures Procedures   Medications Ordered in ED Medications  sodium chloride 0.9 % bolus 1,000 mL (1,000 mLs Intravenous New Bag/Given 03/25/21 1219)    ED Course  I have reviewed the triage vital signs and the nursing notes.  Pertinent labs & imaging results that were available during my care of the patient were reviewed by me and considered in my medical decision making (see chart for details).    MDM  Rules/Calculators/A&P                           Labs and CT are unremarkable.  She has diffuse paresthesias on the right side.  If she had a headache I would say this sounds like a complicated migraine.  I discussed with neurology.  Dr Otelia Limes recommends MRI without contrast though agrees is probably not a stroke but we do need to rule out a small thalamic infarct.  Discussed with Dr. Stevie Kern in [facility] emergency department and will transfer there for MRI.  Patient would like to go POV. Final Clinical Impression(s) / ED Diagnoses Final diagnoses:  Right hand paresthesia    Rx / DC Orders ED Discharge Orders     None        Pricilla Loveless, MD 03/25/21 1331

## 2021-03-25 NOTE — ED Notes (Signed)
Correct time on [facility] should be 1130.

## 2021-03-25 NOTE — ED Notes (Signed)
ED Provider at bedside. 

## 2021-03-25 NOTE — ED Notes (Signed)
Report called to Charge RN at [facility]  

## 2021-03-25 NOTE — ED Triage Notes (Signed)
Pt arrives to ED with c/o of numbness and tingling to the right side of the body. This started on Tuesday afternoon 8/09. This includes left cheek, arm, and leg. Pt reports she tested positive for COVID on 8/05. No c/o of SOB. No weakness, speech, or vision abnormalities from baseline.

## 2021-03-25 NOTE — ED Provider Notes (Signed)
Pt sent here from [facility] for evaluation of right-sided numbness.  Patient had a CT scan at med center and was sent here for follow-up MRI.  Patient has had a migraine for the past 8 days.  Patient has experienced some numbness and tingling to her face with migraines in the past. Vital stable.  Alert and oriented.  Patient moves all extremities. Results discussed with patient.  Patient will follow-up with her primary care physician on Monday.  Final diagnosis: Paresthesias  Complicated migraine An After Visit Summary was printed and given to the patient.    Elson Areas, [place] 03/25/21 1701    Margarita Grizzle, MD 03/26/21 1044

## 2021-03-25 NOTE — ED Notes (Signed)
Patient transported to CT 

## 2021-03-26 ENCOUNTER — Telehealth: Payer: Self-pay | Admitting: Internal Medicine

## 2021-03-26 NOTE — Telephone Encounter (Signed)
FYI

## 2021-03-27 ENCOUNTER — Encounter: Payer: Self-pay | Admitting: Internal Medicine

## 2021-04-12 ENCOUNTER — Ambulatory Visit: Payer: BC Managed Care – PPO | Admitting: Internal Medicine

## 2021-10-08 ENCOUNTER — Emergency Department (HOSPITAL_COMMUNITY): Payer: BC Managed Care – PPO

## 2021-10-08 ENCOUNTER — Observation Stay (HOSPITAL_COMMUNITY)
Admission: EM | Admit: 2021-10-08 | Discharge: 2021-10-09 | Disposition: A | Discharge status: Home / Self Care | Payer: BC Managed Care – PPO | Attending: Surgery | Admitting: Surgery

## 2021-10-08 ENCOUNTER — Encounter (HOSPITAL_COMMUNITY): Payer: Self-pay | Admitting: Emergency Medicine

## 2021-10-08 ENCOUNTER — Observation Stay (HOSPITAL_COMMUNITY): Payer: BC Managed Care – PPO

## 2021-10-08 ENCOUNTER — Other Ambulatory Visit: Payer: Self-pay

## 2021-10-08 DIAGNOSIS — Z20822 Contact with and (suspected) exposure to covid-19: Secondary | ICD-10-CM | POA: Diagnosis not present

## 2021-10-08 DIAGNOSIS — T1490XA Injury, unspecified, initial encounter: Secondary | ICD-10-CM

## 2021-10-08 DIAGNOSIS — S0280XA Fracture of other specified skull and facial bones, unspecified side, initial encounter for closed fracture: Secondary | ICD-10-CM | POA: Insufficient documentation

## 2021-10-08 DIAGNOSIS — I959 Hypotension, unspecified: Secondary | ICD-10-CM | POA: Diagnosis not present

## 2021-10-08 DIAGNOSIS — S1181XA Laceration without foreign body of other specified part of neck, initial encounter: Secondary | ICD-10-CM | POA: Diagnosis not present

## 2021-10-08 DIAGNOSIS — Z23 Encounter for immunization: Secondary | ICD-10-CM | POA: Diagnosis not present

## 2021-10-08 DIAGNOSIS — S01311A Laceration without foreign body of right ear, initial encounter: Secondary | ICD-10-CM | POA: Insufficient documentation

## 2021-10-08 DIAGNOSIS — I499 Cardiac arrhythmia, unspecified: Secondary | ICD-10-CM | POA: Diagnosis not present

## 2021-10-08 DIAGNOSIS — S0191XA Laceration without foreign body of unspecified part of head, initial encounter: Principal | ICD-10-CM | POA: Insufficient documentation

## 2021-10-08 DIAGNOSIS — S1191XA Laceration without foreign body of unspecified part of neck, initial encounter: Secondary | ICD-10-CM | POA: Diagnosis present

## 2021-10-08 HISTORY — DX: Anxiety disorder, unspecified: F41.9

## 2021-10-08 LAB — COMPREHENSIVE METABOLIC PANEL
ALT: 16 U/L (ref 0–44)
AST: 27 U/L (ref 15–41)
Albumin: 3.6 g/dL (ref 3.5–5.0)
Alkaline Phosphatase: 61 U/L (ref 38–126)
Anion gap: 13 (ref 5–15)
BUN: 21 mg/dL — ABNORMAL HIGH (ref 6–20)
CO2: 19 mmol/L — ABNORMAL LOW (ref 22–32)
Calcium: 9 mg/dL (ref 8.9–10.3)
Chloride: 106 mmol/L (ref 98–111)
Creatinine, Ser: 1 mg/dL (ref 0.44–1.00)
GFR, Estimated: >60 mL/min (ref >60)
Glucose, Bld: 175 mg/dL — ABNORMAL HIGH (ref 70–99)
Potassium: 3.1 mmol/L — ABNORMAL LOW (ref 3.5–5.1)
Sodium: 138 mmol/L (ref 135–145)
Total Bilirubin: 0.4 mg/dL (ref 0.3–1.2)
Total Protein: 6.2 g/dL — ABNORMAL LOW (ref 6.5–8.1)

## 2021-10-08 LAB — CBC
HCT: 37.3 % (ref 36.0–46.0)
Hemoglobin: 12.8 g/dL (ref 12.0–15.0)
MCH: 30.5 pg (ref 26.0–34.0)
MCHC: 34.3 g/dL (ref 30.0–36.0)
MCV: 88.8 fL (ref 80.0–100.0)
Platelets: 209 10*3/uL (ref 150–400)
RBC: 4.2 MIL/uL (ref 3.87–5.11)
RDW: 12.9 % (ref 11.5–15.5)
WBC: 8.8 10*3/uL (ref 4.0–10.5)
nRBC: 0 % (ref 0.0–0.2)

## 2021-10-08 LAB — RESP PANEL BY RT-PCR (FLU A&B, COVID) ARPGX2
Influenza A by PCR: NEGATIVE
Influenza B by PCR: NEGATIVE
SARS Coronavirus 2 by RT PCR: NEGATIVE

## 2021-10-08 LAB — I-STAT CHEM 8, ED
BUN: 25 mg/dL — ABNORMAL HIGH (ref 6–20)
Calcium, Ion: 1.12 mmol/L — ABNORMAL LOW (ref 1.15–1.40)
Chloride: 105 mmol/L (ref 98–111)
Creatinine, Ser: 1 mg/dL (ref 0.44–1.00)
Glucose, Bld: 173 mg/dL — ABNORMAL HIGH (ref 70–99)
HCT: 36 % (ref 36.0–46.0)
Hemoglobin: 12.2 g/dL (ref 12.0–15.0)
Potassium: 3.2 mmol/L — ABNORMAL LOW (ref 3.5–5.1)
Sodium: 141 mmol/L (ref 135–145)
TCO2: 22 mmol/L (ref 22–32)

## 2021-10-08 LAB — ETHANOL: Alcohol, Ethyl (B): <10 mg/dL (ref <10)

## 2021-10-08 LAB — LACTIC ACID, PLASMA: Lactic Acid, Venous: 4.5 mmol/L (ref 0.5–1.9)

## 2021-10-08 LAB — ABO/RH: ABO/RH(D): O NEG

## 2021-10-08 LAB — PROTIME-INR
INR: 1 (ref 0.8–1.2)
Prothrombin Time: 13.1 seconds (ref 11.4–15.2)

## 2021-10-08 MED ORDER — LACTATED RINGERS IV SOLN: INTRAVENOUS | Status: DC

## 2021-10-08 MED ORDER — DOCUSATE SODIUM 100 MG PO CAPS
100.0000 mg | ORAL_CAPSULE | Freq: Two times a day (BID) | ORAL | Status: DC
  Administered 2021-10-08 – 2021-10-09 (×2): 100 mg via ORAL
  Filled 2021-10-08 (×2): qty 1

## 2021-10-08 MED ORDER — HYDROMORPHONE HCL 1 MG/ML IJ SOLN: 0.5000 mg | INTRAMUSCULAR | Status: DC | PRN

## 2021-10-08 MED ORDER — FENTANYL CITRATE (PF) 100 MCG/2ML IJ SOLN
INTRAMUSCULAR | Status: AC
  Administered 2021-10-08: 50 ug
  Filled 2021-10-08: qty 2

## 2021-10-08 MED ORDER — ONDANSETRON HCL 4 MG/2ML IJ SOLN
4.0000 mg | Freq: Four times a day (QID) | INTRAMUSCULAR | Status: DC | PRN
Start: 2021-10-08 — End: 2021-10-09

## 2021-10-08 MED ORDER — OXYCODONE HCL 5 MG PO TABS
5.0000 mg | ORAL_TABLET | ORAL | Status: DC | PRN
  Administered 2021-10-08 – 2021-10-09 (×2): 5 mg via ORAL
  Filled 2021-10-08 (×2): qty 1

## 2021-10-08 MED ORDER — ONDANSETRON HCL 4 MG/2ML IJ SOLN
INTRAMUSCULAR | Status: AC | PRN
  Administered 2021-10-08: 4 mg via INTRAVENOUS

## 2021-10-08 MED ORDER — ONDANSETRON 4 MG PO TBDP: 4.0000 mg | ORAL_TABLET | Freq: Four times a day (QID) | ORAL | Status: DC | PRN

## 2021-10-08 MED ORDER — CEFAZOLIN SODIUM-DEXTROSE 2-4 GM/100ML-% IV SOLN
2.0000 g | Freq: Once | INTRAVENOUS | Status: AC
Start: 2021-10-08 — End: 2021-10-08
  Administered 2021-10-08: 2 g via INTRAVENOUS

## 2021-10-08 MED ORDER — LIDOCAINE-EPINEPHRINE (PF) 2 %-1:200000 IJ SOLN
INTRAMUSCULAR | Status: AC
  Filled 2021-10-08: qty 20

## 2021-10-08 MED ORDER — ACETAMINOPHEN 325 MG PO TABS
650.0000 mg | ORAL_TABLET | Freq: Four times a day (QID) | ORAL | Status: DC
  Administered 2021-10-08 – 2021-10-09 (×2): 650 mg via ORAL
  Filled 2021-10-08 (×2): qty 2

## 2021-10-08 MED ORDER — LORAZEPAM 2 MG/ML IJ SOLN
1.0000 mg | Freq: Once | INTRAMUSCULAR | Status: AC | PRN
  Administered 2021-10-08: 1 mg via INTRAVENOUS
  Filled 2021-10-08: qty 1

## 2021-10-08 MED ORDER — TETANUS-DIPHTH-ACELL PERTUSSIS 5-2.5-18.5 LF-MCG/0.5 IM SUSY
0.5000 mL | PREFILLED_SYRINGE | Freq: Once | INTRAMUSCULAR | Status: AC
  Administered 2021-10-08: 0.5 mL via INTRAMUSCULAR

## 2021-10-08 MED ORDER — ENOXAPARIN SODIUM 30 MG/0.3ML IJ SOSY
30.0000 mg | PREFILLED_SYRINGE | Freq: Two times a day (BID) | INTRAMUSCULAR | Status: DC
  Filled 2021-10-08: qty 0.3

## 2021-10-08 MED ORDER — IOHEXOL 350 MG/ML SOLN
100.0000 mL | Freq: Once | INTRAVENOUS | Status: AC | PRN
  Administered 2021-10-08: 100 mL via INTRAVENOUS

## 2021-10-08 NOTE — Progress Notes (Signed)
°   10/08/21 1800  Clinical Encounter Type  Visited With Patient not available  Visit Type Initial  Referral From Nurse  Consult/Referral To None   Chaplain responded to a level one trauma page. Patient was receiving care from medical team.  No family was present. I advised nurse if a chaplain was requested to please call us and we would respond.   Valerie Roys Chaplain Resident  [facility] [phone]

## 2021-10-08 NOTE — ED Notes (Signed)
ENT at bedside
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
## 2021-10-08 NOTE — ED Notes (Signed)
Patient transported to MRI 
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
## 2021-10-09 LAB — BPAM RBC
Blood Product Expiration Date: 202303022359
Blood Product Expiration Date: 202303032359
Blood Product Expiration Date: 202303082359
Blood Product Expiration Date: 202303112359
ISSUE DATE / TIME: 202302241759
ISSUE DATE / TIME: 202302241800
ISSUE DATE / TIME: 202302241818
ISSUE DATE / TIME: 202302250641
Unit Type and Rh: 9500
Unit Type and Rh: 9500
Unit Type and Rh: 9500
Unit Type and Rh: 9500

## 2021-10-09 LAB — TYPE AND SCREEN
ABO/RH(D): O NEG
Antibody Screen: NEGATIVE
Unit division: 0
Unit division: 0
Unit division: 0
Unit division: 0

## 2021-10-09 LAB — CBC
HCT: 34.4 % — ABNORMAL LOW (ref 36.0–46.0)
Hemoglobin: 11.8 g/dL — ABNORMAL LOW (ref 12.0–15.0)
MCH: 30 pg (ref 26.0–34.0)
MCHC: 34.3 g/dL (ref 30.0–36.0)
MCV: 87.5 fL (ref 80.0–100.0)
Platelets: 140 10*3/uL — ABNORMAL LOW (ref 150–400)
RBC: 3.93 MIL/uL (ref 3.87–5.11)
RDW: 13 % (ref 11.5–15.5)
WBC: 12.2 10*3/uL — ABNORMAL HIGH (ref 4.0–10.5)
nRBC: 0 % (ref 0.0–0.2)

## 2021-10-09 LAB — BASIC METABOLIC PANEL
Anion gap: 8 (ref 5–15)
BUN: 14 mg/dL (ref 6–20)
CO2: 22 mmol/L (ref 22–32)
Calcium: 8 mg/dL — ABNORMAL LOW (ref 8.9–10.3)
Chloride: 107 mmol/L (ref 98–111)
Creatinine, Ser: 0.69 mg/dL (ref 0.44–1.00)
GFR, Estimated: >60 mL/min (ref >60)
Glucose, Bld: 111 mg/dL — ABNORMAL HIGH (ref 70–99)
Potassium: 3.5 mmol/L (ref 3.5–5.1)
Sodium: 137 mmol/L (ref 135–145)

## 2021-10-09 LAB — BLOOD PRODUCT ORDER (VERBAL) VERIFICATION

## 2021-10-09 LAB — HIV ANTIBODY (ROUTINE TESTING W REFLEX): HIV Screen 4th Generation wRfx: NONREACTIVE

## 2021-10-09 MED ORDER — TRAMADOL HCL 50 MG PO TABS: 50.0000 mg | ORAL_TABLET | Freq: Four times a day (QID) | ORAL | Status: DC | PRN

## 2021-10-09 MED ORDER — TRAMADOL HCL 50 MG PO TABS: 50.0000 mg | ORAL_TABLET | Freq: Four times a day (QID) | ORAL | 0 refills | Status: DC | PRN
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
## 2021-10-09 NOTE — Discharge Instructions (Addendum)
POST OP INSTRUCTIONS  DIET: As tolerated  Take your usually prescribed home medications unless otherwise directed.  PAIN CONTROL: Pain is best controlled by a usual combination of three different methods TOGETHER: Ice/Heat Over the counter pain medication Prescription pain medication Most patients will experience some swelling and bruising around the wound closure site.  Ice packs or heating pads (30-60 minutes up to 6 times a day) will help. Some people prefer to use ice alone, heat alone, alternating between ice & heat.  Experiment to what works for you.  Swelling and bruising can take several weeks to resolve.   It is helpful to take an over-the-counter pain medication regularly for the first few weeks: Ibuprofen (Motrin/Advil) - 200mg  tabs - take 3 tabs (600mg ) every 6 hours as needed for pain Acetaminophen (Tylenol) - you may take 650mg  every 6 hours as needed. You can take this with motrin as they act differently on the body. If you are taking a narcotic pain medication that has acetaminophen in it, do not take over the counter tylenol at the same time.  Iii. NOTE: You may take both of these medications together - most patients  find it most helpful when alternating between the two (i.e. Ibuprofen at 6am, tylenol at 9am, ibuprofen at 12pm ..Marland Kitchen) A  prescription for pain medication should be given to you upon discharge.  Take your pain medication as prescribed if your pain is not adequatly controlled with the over-the-counter pain reliefs mentioned above.  Avoid getting constipated.  Increasing fluid intake and taking a fiber supplement (such as Metamucil, Citrucel, FiberCon, MiraLax, etc) 1-2 times a day regularly will usually help prevent this problem from occurring.  A mild laxative (prune juice, Milk of Magnesia, MiraLax, etc) should be taken according to package directions if there are no bowel movements after 48 hours.    Dressing: Beginning tomorrow you may bathe normally with  soap/water over wounds  ACTIVITIES as tolerated   FOLLOW UP with Dr. Janace Hoard in 6 days for suture removal.  9. If you have disability or family leave forms that need to be completed, you may have them completed by your primary care physician's office; for return to work instructions, please ask our office staff and they will be happy to assist you in obtaining this documentation   Call Dr. Janace Hoard if any issues: Poor pain control Reactions / problems with new medications (rash/itching, etc)  Fever over 101.5 F (38.5 C) Inability to urinate Nausea/vomiting Worsening swelling or bruising Continued bleeding from incision. Increased pain, redness, or drainage from the incision   If you have a medical emergency, go to the nearest emergency room or call 911.     For counseling supports please see the following   [facility] email: [email or web address] phone: [phone] fax: [phone]

## 2021-10-09 NOTE — TOC Transition Note (Signed)
Transition of Care [facility]) - CM/SW Discharge Note   Patient Details  Name: Atiyana Welte MRN: [id number] Date of Birth: 06/25/1976  [facility]) CM/SW Contact:  Leone Haven, RN Phone Number: 10/09/2021, 8:40 AM   Clinical Narrative:    Patient is for dc today, CSW is seeing patient for resources.          Patient Goals and CMS Choice        Discharge Placement                       Discharge Plan and Services                                     Social Determinants of Health (SDOH) Interventions     Readmission Risk Interventions No flowsheet data found.
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
## 2021-10-09 NOTE — Progress Notes (Signed)
Pt with d.c orders. Discharge paperwork reviewed with pt and all questions answered. IVs removed. Pt escorted out via wheelchair with all belongings to private vehicle.

## 2021-10-11 ENCOUNTER — Encounter (HOSPITAL_BASED_OUTPATIENT_CLINIC_OR_DEPARTMENT_OTHER): Payer: Self-pay | Admitting: Emergency Medicine

## 2021-10-21 ENCOUNTER — Ambulatory Visit (INDEPENDENT_AMBULATORY_CARE_PROVIDER_SITE_OTHER): Payer: BC Managed Care – PPO | Admitting: Internal Medicine

## 2021-10-21 ENCOUNTER — Encounter: Payer: Self-pay | Admitting: Internal Medicine

## 2021-10-21 ENCOUNTER — Other Ambulatory Visit: Payer: Self-pay

## 2021-10-21 VITALS — BP 102/60 | HR 62 | Temp 98.5°F | Ht 64.0 in | Wt 168.0 lb

## 2021-10-21 DIAGNOSIS — S0191XA Laceration without foreign body of unspecified part of head, initial encounter: Secondary | ICD-10-CM

## 2021-10-21 DIAGNOSIS — Z0001 Encounter for general adult medical examination with abnormal findings: Secondary | ICD-10-CM

## 2021-10-21 DIAGNOSIS — R739 Hyperglycemia, unspecified: Secondary | ICD-10-CM

## 2021-10-21 DIAGNOSIS — E538 Deficiency of other specified B group vitamins: Secondary | ICD-10-CM | POA: Diagnosis not present

## 2021-10-21 DIAGNOSIS — E559 Vitamin D deficiency, unspecified: Secondary | ICD-10-CM | POA: Diagnosis not present

## 2021-10-21 DIAGNOSIS — S1191XA Laceration without foreign body of unspecified part of neck, initial encounter: Secondary | ICD-10-CM

## 2021-10-21 DIAGNOSIS — F418 Other specified anxiety disorders: Secondary | ICD-10-CM | POA: Diagnosis not present

## 2021-10-21 NOTE — Assessment & Plan Note (Signed)
Age and sex appropriate education and counseling updated with regular exercise and diet ?Referrals for preventative services - pt to f/u gyn and pap/mammogram ?Immunizations addressed - declines covid booster ?Smoking counseling  - none needed ?Evidence for depression or other mood disorder - none significant ?Most recent labs reviewed. ?I have personally reviewed and have noted: ?1) the patient's medical and social history ?2) The patient's current medications and supplements ?3) The patient's height, weight, and BMI have been recorded in the chart ? ?

## 2021-10-21 NOTE — Assessment & Plan Note (Signed)
Wounds essentially healed, no s/s infection, stitches removed, to f/u any worsening s/s. ?
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
## 2021-10-21 NOTE — Patient Instructions (Signed)
Your stitches were removed today ? ?Your FMLA was signed today ? ?Please continue all other medications as before, and refills have been done if requested. ? ?Please have the pharmacy call with any other refills you may need. ? ?Please continue your efforts at being more active, low cholesterol diet, and weight control. ? ?You are otherwise up to date with prevention measures today. ? ?Please keep your appointments with your specialists as you may have planned ? ?We can hold on further labs today ? ?Please make an Appointment to return for your 1 year visit, or sooner if needed, with Lab testing by Appointment as well, to be done about 3-5 days before at the FIRST FLOOR Lab (so this is for TWO appointments - please see the scheduling desk as you leave) ? ? ?Due to the ongoing Covid 19 pandemic, our lab now requires an appointment for any labs done at our office.  If you need labs done and do not have an appointment, please call our office ahead of time to schedule before presenting to the lab for your testing. ? ? ?

## 2021-10-21 NOTE — Assessment & Plan Note (Signed)
Lab Results  ?Component Value Date  ? HGBA1C 4.7 04/01/2020  ? ?Stable, pt to continue current medical treatment , for f/u a1c with next lab ? ?

## 2021-10-21 NOTE — Assessment & Plan Note (Signed)
Improved now back on zoloft x 6 mo - cont same tx ?

## 2021-11-30 ENCOUNTER — Telehealth: Payer: Self-pay

## 2021-11-30 NOTE — Telephone Encounter (Signed)
Should I call the patient and schedule an appointment? ?
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
## 2021-11-30 NOTE — Telephone Encounter (Signed)
Attempted to call patient on both mobile and home phone numbers. There was no answer and was unable to leave a message for a call back. I will call patient again at a later time to schedule. ?

## 2021-11-30 NOTE — Telephone Encounter (Signed)
Ok sounds good for ROV ?

## 2021-12-02 NOTE — Telephone Encounter (Signed)
Pt made appt for 12/03/21.Marland KitchenClosing encounter..Kimberly Fernandez ?

## 2021-12-03 ENCOUNTER — Telehealth (INDEPENDENT_AMBULATORY_CARE_PROVIDER_SITE_OTHER): Payer: BC Managed Care – PPO | Admitting: Internal Medicine

## 2021-12-03 DIAGNOSIS — S1191XA Laceration without foreign body of unspecified part of neck, initial encounter: Secondary | ICD-10-CM | POA: Diagnosis not present

## 2021-12-03 DIAGNOSIS — S0191XA Laceration without foreign body of unspecified part of head, initial encounter: Secondary | ICD-10-CM

## 2021-12-03 DIAGNOSIS — R519 Headache, unspecified: Secondary | ICD-10-CM | POA: Diagnosis not present

## 2021-12-03 DIAGNOSIS — F418 Other specified anxiety disorders: Secondary | ICD-10-CM

## 2021-12-03 DIAGNOSIS — R202 Paresthesia of skin: Secondary | ICD-10-CM

## 2021-12-03 MED ORDER — TRAMADOL HCL 50 MG PO TABS: 50.0000 mg | ORAL_TABLET | Freq: Four times a day (QID) | ORAL | 0 refills | Status: AC | PRN
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
## 2021-12-06 ENCOUNTER — Encounter: Payer: Self-pay | Admitting: Internal Medicine

## 2021-12-06 DIAGNOSIS — R202 Paresthesia of skin: Secondary | ICD-10-CM | POA: Insufficient documentation

## 2021-12-06 DIAGNOSIS — R519 Headache, unspecified: Secondary | ICD-10-CM | POA: Insufficient documentation
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
## 2021-12-06 NOTE — Assessment & Plan Note (Signed)
With mild increased depressive symptoms, pt declines need for change in tx or counseling referral for now ?
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
## 2021-12-06 NOTE — Patient Instructions (Signed)
You will be contacted regarding the referral for: neurology ? ?Please continue all other medications as before, and refills have been done if requested - tramadol ? ?Please have the pharmacy call with any other refills you may need. ? ?Please continue your efforts at being more active, low cholesterol diet, and weight control. ? ?Please keep your appointments with your specialists as you may have planned ? ? ? ?
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
## 2022-01-24 ENCOUNTER — Ambulatory Visit (INDEPENDENT_AMBULATORY_CARE_PROVIDER_SITE_OTHER): Payer: BC Managed Care – PPO | Admitting: Neurology

## 2022-01-24 ENCOUNTER — Encounter: Payer: Self-pay | Admitting: Neurology

## 2022-01-24 VITALS — BP 133/78 | HR 52 | Ht 64.0 in | Wt 174.0 lb

## 2022-01-24 DIAGNOSIS — S0451XA Injury of facial nerve, right side, initial encounter: Secondary | ICD-10-CM | POA: Diagnosis not present

## 2022-01-24 MED ORDER — PREGABALIN 50 MG PO CAPS: 50.0000 mg | ORAL_CAPSULE | Freq: Two times a day (BID) | ORAL | 6 refills | Status: AC

## 2022-01-24 NOTE — Patient Instructions (Signed)
- Just started Cymbalta. Would continue to increase that for pain (started for anxiety/depression but is also a good pain and migraine medication as well) - She declines Gabapentin, will start Lyrica and continue to increase as needed and as tolerate.   Pregabalin Capsules What is this medication? PREGABALIN (pre GAB a lin) treats nerve pain. It may also be used to prevent and control seizures in people with epilepsy. It works by calming overactive nerves in your body. This medicine may be used for other purposes; ask your health care provider or pharmacist if you have questions. COMMON BRAND NAME(S): Lyrica What should I tell my care team before I take this medication? They need to know if you have any of these conditions: Drug abuse or addiction Heart failure Kidney disease Lung disease Suicidal thoughts, plans or attempt An unusual or allergic reaction to pregabalin, other medications, foods, dyes, or preservatives Pregnant or trying to get pregnant Breast-feeding How should I use this medication? Take this medication by mouth with water. Take it as directed on the prescription label at the same time every day. You can take it with or without food. If it upsets your stomach, take it with food. Keep taking it unless your care team tells you to stop. A special MedGuide will be given to you by the pharmacist with each prescription and refill. Be sure to read this information carefully each time. Talk to your care team about the use of this medication in children. While it may be prescribed for children as young as 1 month for selected conditions, precautions do apply. Overdosage: If you think you have taken too much of this medicine contact a poison control center or emergency room at once. NOTE: This medicine is only for you. Do not share this medicine with others. What if I miss a dose? If you miss a dose, take it as soon as you can. If it is almost time for your next dose, take only  that dose. Do not take double or extra doses. What may interact with this medication? Alcohol Antihistamines for allergy, cough, and cold Certain medications for anxiety or sleep Certain medications for blood pressure, heart disease Certain medications for depression like amitriptyline, fluoxetine, sertraline Certain medications for diabetes, like pioglitazone, rosiglitazone Certain medications for seizures like phenobarbital, primidone General anesthetics like halothane, isoflurane, methoxyflurane, propofol Medications that relax muscles for surgery Narcotic medications for pain Phenothiazines like chlorpromazine, mesoridazine, prochlorperazine, thioridazine This list may not describe all possible interactions. Give your health care provider a list of all the medicines, herbs, non-prescription drugs, or dietary supplements you use. Also tell them if you smoke, drink alcohol, or use illegal drugs. Some items may interact with your medicine. What should I watch for while using this medication? Visit your care team for regular checks on your progress. Tell your care team if your symptoms do not start to get better or if they get worse. Do not suddenly stop taking this medication. You may develop a severe reaction. Your care team will tell you how much medication to take. If your care team wants you to stop the medication, the dose may be slowly lowered over time to avoid any side effects. You may get drowsy or dizzy. Do not drive, use machinery, or do anything that needs mental alertness until you know how this medication affects you. Do not stand up or sit up quickly, especially if you are an older patient. This reduces the risk of dizzy or fainting spells. Alcohol may  interfere with the effect of this medication. Avoid alcoholic drinks. If you or your family notice any changes in your behavior, such as new or worsening depression, thoughts of harming yourself, anxiety, other unusual or disturbing  thoughts, or memory loss, call your care team right away. Wear a medical ID bracelet or chain if you are taking this medication for seizures. Carry a card that describes your condition. List the medications and doses you take on the card. This medication may make it more difficult to father a child. Talk to your care team if you are concerned about your fertility. What side effects may I notice from receiving this medication? Side effects that you should report to your care team as soon as possible: Allergic reactions or angioedema--skin rash, itching, hives, swelling of the face, eyes, lips, tongue, arms, or legs, trouble swallowing or breathing Blurry vision Thoughts of suicide or self-harm, worsening mood, feelings of depression Trouble breathing Side effects that usually do not require medical attention (report to your care team if they continue or are bothersome): Dizziness Drowsiness Dry mouth Nausea Swelling of the ankles, feet, hands Vomiting Weight gain This list may not describe all possible side effects. Call your doctor for medical advice about side effects. You may report side effects to FDA at 1-800-FDA-1088. Where should I keep my medication? Keep out of the reach of children and pets. This medication can be abused. Keep it in a safe place to protect it from theft. Do not share it with anyone. It is only for you. Selling or giving away this medication is dangerous and against the law. Store at [employer] C (77 degrees F). Get rid of any unused medication after the expiration date. This medication may cause harm and death if it is taken by other adults, children, or pets. It is important to get rid of the medication as soon as you no longer need it, or it is expired. You can do this in two ways: Take the medication to a medication take-back program. Check with your pharmacy or law enforcement to find a location. If you cannot return the medication, check the label or package  insert to see if the medication should be thrown out in the garbage or flushed down the toilet. If you are not sure, ask your care team. If it is safe to put it in the trash, take the medication out of the container. Mix the medication with cat litter, dirt, coffee grounds, or other unwanted substance. Seal the mixture in a bag or container. Put it in the trash. NOTE: This sheet is a summary. It may not cover all possible information. If you have questions about this medicine, talk to your doctor, pharmacist, or health care provider.  2023 Elsevier/Gold Standard (2020-08-05 00:00:00)
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
## 2022-01-27 ENCOUNTER — Encounter: Payer: Self-pay | Admitting: Neurology

## 2022-06-02 ENCOUNTER — Ambulatory Visit: Payer: BC Managed Care – PPO | Admitting: Adult Health

## 2023-09-21 ENCOUNTER — Ambulatory Visit: Payer: Self-pay | Admitting: Internal Medicine

## 2023-09-21 NOTE — Telephone Encounter (Signed)
  Chief Complaint: high blood pressure Symptoms: high bp readings, dizziness Frequency: the past few days Pertinent Negatives: Patient denies fever, chest pain, difficulty breathing Disposition: [] ED /[] Urgent Care (no appt availability in office) / [x] Appointment(In office/virtual)/ []  [facility]/ [] Home Care/ [] Refused Recommended Disposition /[] [facility]/ []  Follow-up with PCP Additional Notes: Patient reports she has noticed high BP readings over the last few days. Patient reports she does not have a hx of high bp or take any bp meds. Her reading this mornign was 138/95. Patient reports she will occasionally get dizzy during these episodes, but not all the time. Per protocol, pt scheduled for appt tomorrow 2/7. Patient advised to call back with worsening symptoms. Patient verbalized understanding.       Copied from CRM [id number]. Topic: Clinical - Red Word Triage >> Sep 21, 2023 11:05 AM Drema MATSU wrote: Red Word that prompted transfer to Nurse Triage: Patient husband stated that wife is having blood pressure issues. She has headaches and dizziness sometimes. No symptoms at this time  -call was dropped/ attempted to call husband back no answer Reason for Disposition  [1] Systolic BP  >= 130 OR Diastolic >= 80 AND [2] not taking BP medications  Answer Assessment - Initial Assessment Questions 1. BLOOD PRESSURE: What is the blood pressure? Did you take at least two measurements 5 minutes apart?     138/95 2. ONSET: When did you take your blood pressure?     The past few days 3. HOW: How did you take your blood pressure? (e.g., automatic home BP monitor, visiting nurse)     Automatic home bp cuff 4. HISTORY: Do you have a history of high blood pressure?     no 5. MEDICINES: Are you taking any medicines for blood pressure? Have you missed any doses recently?     No medication 6. OTHER SYMPTOMS: Do you have any symptoms? (e.g., blurred vision,  chest pain, difficulty breathing, headache, weakness)     Intermittent dizziness  Protocols used: Blood Pressure - High-A-AH

## 2023-09-22 ENCOUNTER — Ambulatory Visit (INDEPENDENT_AMBULATORY_CARE_PROVIDER_SITE_OTHER): Payer: 59 | Admitting: Family Medicine

## 2023-09-22 VITALS — BP 144/90 | HR 72 | Temp 97.6°F | Ht 64.0 in | Wt 184.2 lb

## 2023-09-22 DIAGNOSIS — R1310 Dysphagia, unspecified: Secondary | ICD-10-CM | POA: Insufficient documentation

## 2023-09-22 DIAGNOSIS — K5900 Constipation, unspecified: Secondary | ICD-10-CM | POA: Insufficient documentation

## 2023-09-22 DIAGNOSIS — I10 Essential (primary) hypertension: Secondary | ICD-10-CM

## 2023-09-22 DIAGNOSIS — R1012 Left upper quadrant pain: Secondary | ICD-10-CM | POA: Insufficient documentation

## 2023-09-22 LAB — COMPREHENSIVE METABOLIC PANEL WITH GFR
ALT: 13 U/L (ref 0–35)
AST: 19 U/L (ref 0–37)
Albumin: 4.5 g/dL (ref 3.5–5.2)
Alkaline Phosphatase: 81 U/L (ref 39–117)
BUN: 16 mg/dL (ref 6–23)
CO2: 28 meq/L (ref 19–32)
Calcium: 9.5 mg/dL (ref 8.4–10.5)
Chloride: 104 meq/L (ref 96–112)
Creatinine, Ser: 0.75 mg/dL (ref 0.40–1.20)
GFR: 96 mL/min
Glucose, Bld: 87 mg/dL (ref 70–99)
Potassium: 4.5 meq/L (ref 3.5–5.1)
Sodium: 141 meq/L (ref 135–145)
Total Bilirubin: 0.4 mg/dL (ref 0.2–1.2)
Total Protein: 7.7 g/dL (ref 6.0–8.3)

## 2023-09-22 LAB — CBC WITH DIFFERENTIAL/PLATELET
Basophils Absolute: 0 K/uL (ref 0.0–0.1)
Basophils Relative: 0.2 % (ref 0.0–3.0)
Eosinophils Absolute: 0.1 K/uL (ref 0.0–0.7)
Eosinophils Relative: 1.4 % (ref 0.0–5.0)
HCT: 37.3 % (ref 36.0–46.0)
Hemoglobin: 12 g/dL (ref 12.0–15.0)
Lymphocytes Relative: 32 % (ref 12.0–46.0)
Lymphs Abs: 2.5 K/uL (ref 0.7–4.0)
MCHC: 32.1 g/dL (ref 30.0–36.0)
MCV: 78.6 fl (ref 78.0–100.0)
Monocytes Absolute: 0.5 K/uL (ref 0.1–1.0)
Monocytes Relative: 6.6 % (ref 3.0–12.0)
Neutro Abs: 4.6 K/uL (ref 1.4–7.7)
Neutrophils Relative %: 59.8 % (ref 43.0–77.0)
Platelets: 219 K/uL (ref 150.0–400.0)
RBC: 4.75 Mil/uL (ref 3.87–5.11)
RDW: 17.7 % — ABNORMAL HIGH (ref 11.5–15.5)
WBC: 7.8 K/uL (ref 4.0–10.5)

## 2023-09-22 MED ORDER — LOSARTAN POTASSIUM 25 MG PO TABS: 25.0000 mg | ORAL_TABLET | Freq: Every day | ORAL | 1 refills | Status: DC

## 2023-09-22 NOTE — Progress Notes (Signed)
   Acute Office Visit  Subjective:     Patient ID: Kimberly Fernandez, female    DOB: 1975-11-20, 48 y.o.   MRN: [id number]  Chief Complaint  Patient presents with   Blood Pressure Issues    Patient states that her BP has been fluctuating and she's concerned. Yesterday is was 138/95, spots in her vision, aching In her head .     HPI Patient is in today for evaluation of elevated blood pressures. Reports that she has been taking blood pressures at home and beginning 130-140/90s. Reports that she can feel when her blood pressure is elevated, she will have headaches, and feel more fatigued. Denies any chest pain, shortness of breath, dizziness, vision changes, numbness, tingling, other symptoms.  ROS Per HPI      Objective:    BP (!) 144/90 (BP Location: Left Arm, Patient Position: Sitting, Cuff Size: Normal)   Pulse 72   Temp 97.6 F (36.4 C) (Oral)   Ht 5' 4 (1.626 m)   Wt 184 lb 3.2 oz (83.6 kg)   LMP  (LMP Unknown)   SpO2 99%   BMI 31.62 kg/m    Physical Exam Vitals and nursing note reviewed.  Constitutional:      General: She is not in acute distress.    Appearance: Normal appearance. She is normal weight.  HENT:     Head: Normocephalic and atraumatic.     Right Ear: Tympanic membrane and ear canal normal.     Left Ear: Tympanic membrane and ear canal normal.     Nose: Nose normal.  Eyes:     Extraocular Movements: Extraocular movements intact.     Pupils: Pupils are equal, round, and reactive to light.  Cardiovascular:     Rate and Rhythm: Normal rate and regular rhythm.     Pulses: Normal pulses.     Heart sounds: Normal heart sounds.  Pulmonary:     Effort: Pulmonary effort is normal. No respiratory distress.     Breath sounds: Normal breath sounds. No wheezing, rhonchi or rales.  Musculoskeletal:        General: Normal range of motion.     Cervical back: Normal range of motion.     Right lower leg: No edema.     Left lower leg: No edema.   Neurological:     General: No focal deficit present.     Mental Status: She is alert and oriented to person, place, and time.  Psychiatric:        Mood and Affect: Mood normal.        Thought Content: Thought content normal.     No results found for any visits on 09/22/23.      Assessment & Plan:  1. Primary hypertension (Primary)  - CBC with Differential/Platelet - Comprehensive metabolic panel - losartan  (COZAAR ) 25 MG tablet; Take 1 tablet (25 mg total) by mouth daily.  Dispense: 30 tablet; Refill: 1   Meds ordered this encounter  Medications   losartan  (COZAAR ) 25 MG tablet    Sig: Take 1 tablet (25 mg total) by mouth daily.    Dispense:  30 tablet    Refill:  1    Return in about 4 weeks (around 10/20/2023) for BP check, meds.  Corean Ku, FNP

## 2023-09-24 ENCOUNTER — Encounter: Payer: Self-pay | Admitting: Family Medicine

## 2023-10-15 ENCOUNTER — Other Ambulatory Visit: Payer: Self-pay | Admitting: Family Medicine

## 2023-10-15 DIAGNOSIS — I10 Essential (primary) hypertension: Secondary | ICD-10-CM

## 2023-11-14 ENCOUNTER — Other Ambulatory Visit: Payer: Self-pay | Admitting: Family Medicine

## 2023-11-14 DIAGNOSIS — I10 Essential (primary) hypertension: Secondary | ICD-10-CM

## 2023-11-18 ENCOUNTER — Other Ambulatory Visit: Payer: Self-pay | Admitting: Family Medicine

## 2023-11-18 DIAGNOSIS — I10 Essential (primary) hypertension: Secondary | ICD-10-CM

## 2024-01-09 ENCOUNTER — Ambulatory Visit: Payer: Self-pay

## 2024-01-09 ENCOUNTER — Ambulatory Visit: Payer: Self-pay | Admitting: *Deleted

## 2024-01-09 ENCOUNTER — Ambulatory Visit: Admitting: Internal Medicine

## 2024-01-09 NOTE — Telephone Encounter (Signed)
Please see other triage encounter.

## 2024-01-09 NOTE — Telephone Encounter (Signed)
 Copied from CRM [id number]. Topic: Clinical - Red Word Triage >> Jan 09, 2024  8:30 AM Emmet Harm C wrote: Red Word that prompted transfer to Nurse Triage: severe light headness,  extreme unbalance and can barely walk straight  Call disconnected during transfer; attempted call back, no answer.

## 2024-01-09 NOTE — Telephone Encounter (Signed)
  Chief Complaint: lightheaded, dizziness right ear pain, mild congestion Symptoms: see above, unbalanced with walking but can walk. Started last Tuesday x 1 episode and again this 4 am. Last BP 121/79 at 0600 today .  Frequency: last Tuesday and again this am  Pertinent Negatives: Patient denies chest pain no difficulty breathing no fever no headaches no blurred vision, no weakness on either side of body no N/V. No feeling like could faint. Disposition: [] ED /[] Urgent Care (no appt availability in office) / [x] Appointment(In office/virtual)/ []  [facility]/ [] Home Care/ [] Refused Recommended Disposition /[] [facility]/ []  Follow-up with PCP Additional Notes:   Appt scheduled today . Recommended if sx worsen go to UC/ED.      Copied from CRM [id number]. Topic: Clinical - Red Word Triage >> Jan 09, 2024  8:46 AM Georgeann Kindred wrote: Red Word that prompted transfer to Nurse Triage: Patient called in with complaints of severe light headedness. She states that she is unbalanced and unable to walk straight. She has mild congestion and mild burning in her right ear. Reason for Disposition  [1] MODERATE dizziness (e.g., interferes with normal activities) AND [2] has NOT been evaluated by doctor (or NP/PA) for this  (Exception: Dizziness caused by heat exposure, sudden standing, or poor fluid intake.)  Answer Assessment - Initial Assessment Questions 1. DESCRIPTION: "Describe your dizziness."     Lightheaded at 4 am  2. LIGHTHEADED: "Do you feel lightheaded?" (e.g., somewhat faint, woozy, weak upon standing)     Can't control head motions  3. VERTIGO: "Do you feel like either you or the room is spinning or tilting?" (i.e. vertigo)     no 4. SEVERITY: "How bad is it?"  "Do you feel like you are going to faint?" "Can you stand and walk?"   - MILD: Feels slightly dizzy, but walking normally.   - MODERATE: Feels unsteady when walking, but not falling; interferes with normal activities  (e.g., school, work).   - SEVERE: Unable to walk without falling, or requires assistance to walk without falling; feels like passing out now.      Laying down feels like going to roll down hill 5. ONSET:  "When did the dizziness begin?"     Last week  6. AGGRAVATING FACTORS: "Does anything make it worse?" (e.g., standing, change in head position)     Movement  7. HEART RATE: "Can you tell me your heart rate?" "How many beats in 15 seconds?"  (Note: not all patients can do this)       na 8. CAUSE: "What do you think is causing the dizziness?"     Not sure  9. RECURRENT SYMPTOM: "Have you had dizziness before?" If Yes, ask: "When was the last time?" "What happened that time?"     Yes last week  10. OTHER SYMPTOMS: "Do you have any other symptoms?" (e.g., fever, chest pain, vomiting, diarrhea, bleeding)       Light headed  11. PREGNANCY: "Is there any chance you are pregnant?" "When was your last menstrual period?"       na  Protocols used: Dizziness - Lightheadedness-A-AH
